# Patient Record
Sex: Female | Born: 1950 | Race: White | Hispanic: No | Marital: Married | Smoking: Never smoker
Health system: Southern US, Community
[De-identification: ages and names within clinical notes are randomized; demographics above are authoritative.]

## PROBLEM LIST (undated history)

## (undated) DIAGNOSIS — M1990 Unspecified osteoarthritis, unspecified site: Secondary | ICD-10-CM

## (undated) DIAGNOSIS — H269 Unspecified cataract: Secondary | ICD-10-CM

## (undated) DIAGNOSIS — T7840XA Allergy, unspecified, initial encounter: Secondary | ICD-10-CM

## (undated) DIAGNOSIS — I10 Essential (primary) hypertension: Secondary | ICD-10-CM

## (undated) HISTORY — DX: Unspecified cataract: H26.9

## (undated) HISTORY — DX: Essential (primary) hypertension: I10

## (undated) HISTORY — PX: BREAST BIOPSY: SHX20

## (undated) HISTORY — DX: Allergy, unspecified, initial encounter: T78.40XA

## (undated) HISTORY — DX: Unspecified osteoarthritis, unspecified site: M19.90

---
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
## 1995-10-15 HISTORY — PX: KNEE SURGERY: SHX244

## 1998-10-14 HISTORY — PX: HYSTEROSCOPY: SHX211

## 2005-10-14 HISTORY — PX: BREAST SURGERY: SHX581

## 2013-12-06 DIAGNOSIS — H5210 Myopia, unspecified eye: Secondary | ICD-10-CM | POA: Insufficient documentation

## 2013-12-06 DIAGNOSIS — H52229 Regular astigmatism, unspecified eye: Secondary | ICD-10-CM | POA: Insufficient documentation

## 2013-12-06 DIAGNOSIS — H43812 Vitreous degeneration, left eye: Secondary | ICD-10-CM | POA: Insufficient documentation

## 2013-12-06 DIAGNOSIS — H25099 Other age-related incipient cataract, unspecified eye: Secondary | ICD-10-CM | POA: Insufficient documentation

## 2016-06-11 LAB — HM PAP SMEAR

## 2016-06-28 LAB — TSH: TSH: 2.18 (ref 0.41–5.90)

## 2016-06-28 LAB — LIPID PANEL
CHOLESTEROL: 226 — AB (ref 0–200)
HDL: 83 — AB (ref 35–70)
LDL Cholesterol: 130
TRIGLYCERIDES: 64 (ref 40–160)

## 2016-06-28 LAB — CBC AND DIFFERENTIAL: WBC: 4

## 2016-06-28 LAB — BASIC METABOLIC PANEL: GLUCOSE: 99

## 2016-07-24 LAB — HM DEXA SCAN

## 2016-07-26 LAB — HM MAMMOGRAPHY

## 2017-04-03 DIAGNOSIS — H25812 Combined forms of age-related cataract, left eye: Secondary | ICD-10-CM | POA: Insufficient documentation

## 2017-04-03 HISTORY — PX: EYE SURGERY: SHX253

## 2017-04-15 ENCOUNTER — Encounter: Payer: Self-pay | Admitting: Osteopathic Medicine

## 2017-04-15 ENCOUNTER — Ambulatory Visit (INDEPENDENT_AMBULATORY_CARE_PROVIDER_SITE_OTHER): Payer: Medicare Other

## 2017-04-15 ENCOUNTER — Ambulatory Visit (INDEPENDENT_AMBULATORY_CARE_PROVIDER_SITE_OTHER): Payer: Medicare Other | Admitting: Osteopathic Medicine

## 2017-04-15 VITALS — BP 133/65 | HR 85 | Wt 162.0 lb

## 2017-04-15 DIAGNOSIS — M79672 Pain in left foot: Secondary | ICD-10-CM

## 2017-04-15 DIAGNOSIS — M7750 Other enthesopathy of unspecified foot: Secondary | ICD-10-CM | POA: Diagnosis not present

## 2017-04-15 DIAGNOSIS — I10 Essential (primary) hypertension: Secondary | ICD-10-CM

## 2017-04-15 MED ORDER — MELOXICAM 15 MG PO TABS: 7.5000 mg | ORAL_TABLET | Freq: Every day | ORAL | 2 refills | Status: DC

## 2017-04-15 NOTE — Progress Notes (Signed)
HPI: Carol Sanford is a 66 y.o. female  who presents to [facility] today, 04/15/17,  for chief complaint of:  Chief Complaint  Patient presents with  . Establish Care  . Foot Pain    Left foot pain for couple months. She notice the pain after wearing low support shoes. She states the pain is some better after changing her shoes. She has taken ibuprofen and has had some relief.   . Hand Pain    Right hand 3rd finger pain for 3 days. Denies injury.     Foot pain . Context: No injury on this foot, but has been more involved in workout regimen lately with Silver sneakers. . Location: Left middle arch . Quality: Sore, occasionally sharp . Duration: Couple months . Modifying factors: Supportive shoes seem to be helpful . Assoc signs/symptoms:   Hand pain  . Location: Third finger of right hand . Quality: Soreness . Severity: He actually has resolved by the time of the visit . Context: No known injury or strain    Past medical history, surgical history, social history and family history reviewed.  Patient Active Problem List   Diagnosis Date Noted  . Hypertension    Past Surgical History:  Procedure Laterality Date  . APPENDECTOMY  1965  . BREAST SURGERY Left 2007   Biopsy left breast benign   . EYE SURGERY Left 04/03/2017   Cataract  . HYSTEROSCOPY  2000  . KNEE SURGERY Left 1997   Social History   Social History  . Marital status: Married    Spouse name: N/A  . Number of children: N/A  . Years of education: N/A   Occupational History  . Not on file.   Social History Main Topics  . Smoking status: Never Smoker  . Smokeless tobacco: Never Used  . Alcohol use Yes  . Drug use: No  . Sexual activity: Not Currently   Other Topics Concern  . Not on file   Social History Narrative  . No narrative on file   Family History  Problem Relation Age of Onset  . Heart attack Mother   . Stroke Mother   . Alcohol abuse Father   .  Hypertension Father      Current medication list and allergy/intolerance information reviewed.  Rx include Losartan   Allergies  Allergen Reactions  . Sulfa Antibiotics Itching      Review of Systems:  Constitutional: No recent illness  HEENT: No  headache, no vision change  Cardiac: No  chest pain, No  pressure, No palpitations  Respiratory:  No  shortness of breath. No  Cough  Gastrointestinal: No  abdominal pain, no change on bowel habits  Musculoskeletal: +new myalgia/arthralgia  Skin: No  Rash  Hem/Onc: No  easy bruising/bleeding, No  abnormal lumps/bumps  Neurologic: No  weakness, No  Dizziness  Psychiatric: No  concerns with depression, No  concerns with anxiety  Exam:  BP 133/65   Pulse 85   Wt 162 lb (73.5 kg)   SpO2 100%   Constitutional: VS see above. General Appearance: alert, well-developed, well-nourished, NAD  Eyes: Normal lids and conjunctive, non-icteric sclera  Ears, Nose, Mouth, Throat: MMM, Normal external inspection ears/nares/mouth/lips/gums.  Neck: No masses, trachea midline.   Respiratory: Normal respiratory effort. no wheeze, no rhonchi, no rales  Cardiovascular: S1/S2 normal, no murmur, no rub/gallop auscultated. RRR.   Musculoskeletal: Gait normal. Symmetric and independent movement of all extremities. Mild tenderness to arch, nothing at calcaneus  or along plantar fascia on left foot. Right fingers appear normal, normal range of motion, no joint effusion  Neurological: Normal balance/coordination. No tremor.  Skin: warm, dry, intact.   Psychiatric: Normal judgment/insight. Normal mood and affect. Oriented x3.     ASSESSMENT/PLAN:   Foot tendinitis - Doubtful for stress fracture given vast improvement depending on foot wear, consider MRI versus immobilization if no better - Plan: DG Foot Complete Left, meloxicam (MOBIC) 15 MG tablet  Essential hypertension - Plan: losartan (COZAAR) 50 MG tablet, CBC, COMPLETE METABOLIC PANEL  WITH GFR, Lipid panel, TSH    Patient Instructions  Plan: Anti-inflammatories x1 week See sports medicine here (Dr. Denyse Amassorey or Dr. Karie Schwalbe) if no better in 2 weeks Plan for annual physical in September   Records requested from previous PCP  Follow-up plan: Return for annual physical in 06/2017 - labs prior to that visit .  Visit summary with medication list and pertinent instructions was printed for patient to review, alert us if any changes needed. All questions at time of visit were answered - patient instructed to contact office with any additional concerns. ER/RTC precautions were reviewed with the patient and understanding verbalized.   Note: Total time spent 30 minutes, greater than 50% of the visit was spent face-to-face counseling and coordinating care for the following: The primary encounter diagnosis was Foot tendinitis. A diagnosis of Essential hypertension was also pertinent to this visit..Marland Kitchen

## 2017-04-15 NOTE — Patient Instructions (Signed)
Plan: Anti-inflammatories x1 week See sports medicine here (Dr. Denyse Amassorey or Dr. Karie Schwalbe) if no better in 2 weeks Plan for annual physical in September

## 2017-06-12 ENCOUNTER — Telehealth: Payer: Self-pay | Admitting: Osteopathic Medicine

## 2017-06-12 NOTE — Telephone Encounter (Signed)
Patient called she has cpe scheduled for 06/19/17 getting labs done tomorrow and has mentioned in prev visit to Dr. Lyn HollingsheadAlexander of wanting a Prolia Injec and she would like to know if she can get that injec and does that need to be ordered. Thanks

## 2017-06-13 LAB — CBC
HEMATOCRIT: 36.8 % (ref 35.0–45.0)
HEMOGLOBIN: 12.2 g/dL (ref 11.7–15.5)
MCH: 31.3 pg (ref 27.0–33.0)
MCHC: 33.2 g/dL (ref 32.0–36.0)
MCV: 94.4 fL (ref 80.0–100.0)
MPV: 10.7 fL (ref 7.5–12.5)
Platelets: 237 10*3/uL (ref 140–400)
RBC: 3.9 MIL/uL (ref 3.80–5.10)
RDW: 13.5 % (ref 11.0–15.0)
WBC: 3.4 10*3/uL — ABNORMAL LOW (ref 3.8–10.8)

## 2017-06-13 NOTE — Telephone Encounter (Signed)
Called patient back she advised that she has been getting Prolia injections for the last 3 years and she was scheduled at prevs pcp office for end of April and that office called and canceled Dr. Had something to do and they rescheduled and then canceled her 2 more times. Pt adv that she called the prevs pcp and they said they sent records.

## 2017-06-13 NOTE — Telephone Encounter (Signed)
Left message on patient vm to call back in reference to  this message. Tudor Chandley,CMA

## 2017-06-13 NOTE — Telephone Encounter (Signed)
We need to special order the Prolia injections.   I don't think I ever received records from her previous PCP - can we confirm with her that she was on Prolia before and if so when was her last injection? The patient may have mentioned it to me at her last visit, but since I don't have records, I don't have any documented history of previous bone density test results to confirm osteoporosis.   I'm okay to go ahead and order it if she's been on in before, would like to have records but if we can't get them or if the patient has never been on Prolia before, we can discuss it at her annual

## 2017-06-13 NOTE — Telephone Encounter (Signed)
Okay to go ahead and order Prolene. [facility]. Thanks!

## 2017-06-14 LAB — COMPLETE METABOLIC PANEL WITH GFR
ALT: 13 U/L (ref 6–29)
AST: 15 U/L (ref 10–35)
Albumin: 4.6 g/dL (ref 3.6–5.1)
Alkaline Phosphatase: 35 U/L (ref 33–130)
BUN: 20 mg/dL (ref 7–25)
CO2: 25 mmol/L (ref 20–32)
CREATININE: 0.85 mg/dL (ref 0.50–0.99)
Calcium: 9.4 mg/dL (ref 8.6–10.4)
Chloride: 103 mmol/L (ref 98–110)
GFR, EST AFRICAN AMERICAN: 83 mL/min (ref >=60)
GFR, EST NON AFRICAN AMERICAN: 72 mL/min (ref >=60)
Glucose, Bld: 92 mg/dL (ref 65–99)
Potassium: 4.7 mmol/L (ref 3.5–5.3)
Sodium: 137 mmol/L (ref 135–146)
TOTAL PROTEIN: 6.6 g/dL (ref 6.1–8.1)
Total Bilirubin: 0.7 mg/dL (ref 0.2–1.2)

## 2017-06-14 LAB — LIPID PANEL
CHOLESTEROL: 206 mg/dL — AB (ref <200)
HDL: 94 mg/dL (ref >50)
LDL CALC: 101 mg/dL — AB (ref <100)
TRIGLYCERIDES: 56 mg/dL (ref <150)
Total CHOL/HDL Ratio: 2.2 Ratio (ref <5.0)
VLDL: 11 mg/dL (ref <30)

## 2017-06-14 LAB — TSH: TSH: 4.4 mIU/L

## 2017-06-18 NOTE — Telephone Encounter (Signed)
We'll look for the records after office hours today and get back to you on this tomorrow.

## 2017-06-18 NOTE — Telephone Encounter (Signed)
Did you receive a copy of her last bone density report? This will be required for prior authorization.

## 2017-06-18 NOTE — Telephone Encounter (Signed)
Dr. Alexander please see note below. Carol Sanford,CMA  

## 2017-06-18 NOTE — Telephone Encounter (Signed)
Kelsi please see note below. Aritza Brunet,CMA  

## 2017-06-19 ENCOUNTER — Other Ambulatory Visit (HOSPITAL_COMMUNITY)
Admission: RE | Admit: 2017-06-19 | Discharge: 2017-06-19 | Disposition: A | Discharge status: Home / Self Care | Payer: Medicare Other | Source: Ambulatory Visit | Attending: Osteopathic Medicine | Admitting: Osteopathic Medicine

## 2017-06-19 ENCOUNTER — Encounter: Payer: Self-pay | Admitting: Osteopathic Medicine

## 2017-06-19 ENCOUNTER — Telehealth: Payer: Self-pay | Admitting: Osteopathic Medicine

## 2017-06-19 ENCOUNTER — Ambulatory Visit (INDEPENDENT_AMBULATORY_CARE_PROVIDER_SITE_OTHER): Payer: Medicare Other | Admitting: Family Medicine

## 2017-06-19 ENCOUNTER — Encounter: Payer: Self-pay | Admitting: Family Medicine

## 2017-06-19 ENCOUNTER — Ambulatory Visit (INDEPENDENT_AMBULATORY_CARE_PROVIDER_SITE_OTHER): Payer: Medicare Other | Admitting: Osteopathic Medicine

## 2017-06-19 VITALS — BP 129/73 | HR 76 | Ht 64.5 in | Wt 161.0 lb

## 2017-06-19 DIAGNOSIS — Z23 Encounter for immunization: Secondary | ICD-10-CM | POA: Diagnosis not present

## 2017-06-19 DIAGNOSIS — M722 Plantar fascial fibromatosis: Secondary | ICD-10-CM | POA: Insufficient documentation

## 2017-06-19 DIAGNOSIS — D72819 Decreased white blood cell count, unspecified: Secondary | ICD-10-CM | POA: Insufficient documentation

## 2017-06-19 DIAGNOSIS — I10 Essential (primary) hypertension: Secondary | ICD-10-CM | POA: Diagnosis not present

## 2017-06-19 DIAGNOSIS — Z8739 Personal history of other diseases of the musculoskeletal system and connective tissue: Secondary | ICD-10-CM | POA: Diagnosis not present

## 2017-06-19 DIAGNOSIS — Z0000 Encounter for general adult medical examination without abnormal findings: Secondary | ICD-10-CM

## 2017-06-19 MED ORDER — FLUTICASONE PROPIONATE 50 MCG/ACT NA SUSP: 2.0000 | Freq: Every day | NASAL | 3 refills | Status: DC

## 2017-06-19 MED ORDER — ZOSTER VAC RECOMB ADJUVANTED 50 MCG/0.5ML IM SUSR: 0.5000 mL | Freq: Once | INTRAMUSCULAR | 1 refills | Status: AC

## 2017-06-19 NOTE — Patient Instructions (Addendum)
PLAN:   Vaccines:  Shingles - Rx printed  Tetanus - every 10 years  Pneumonia - second and last one due   Flu - annual   Other screening:  We need results for colonoscopy, bone density, Pap, mammogram - will request these form your previous doctors (PCP and women's health)   Repeat blood counts in 3 months - if stable low white blood cells, will just monitor every year   Bones!   I'll go ahead and order the Prolia and maybe it will be approved but they might require the bone density reports! We will be in touch. Please call us if you haven't heard back about this in the next 1-2 weeks.   Other:  See additional info printed for advanced directives and let me know if any questions

## 2017-06-19 NOTE — Telephone Encounter (Signed)
I will need the Pt's T Score from last bone density report. Records were requested this morning, Pt has been advised of status.

## 2017-06-19 NOTE — Telephone Encounter (Signed)
Found records with recent lab results and recent progress note but no copy of bone density test results. We'll need to request this specifically [facility] [place] [phone]

## 2017-06-19 NOTE — Patient Instructions (Addendum)
Thank you for coming in today. Do the ice massage nightly. Make sure to pull your big toe back.  Use a frozen cup of water.   Do the heel lift exercises.  Make sure to go down slowly. IT should take 3-4 seconds per lift.  Do about 30 reps 2-3x daily.   Do not walk barefoot.   Consider getting a night splint or a [place] sock.   Recheck with me in 6 weeks or sooner if needed.     Plantar Fasciitis Plantar fasciitis is a painful foot condition that affects the heel. It occurs when the band of tissue that connects the toes to the heel bone (plantar fascia) becomes irritated. This can happen after exercising too much or doing other repetitive activities (overuse injury). The pain from plantar fasciitis can range from mild irritation to severe pain that makes it difficult for you to walk or move. The pain is usually worse in the morning or after you have been sitting or lying down for a while. What are the causes? This condition may be caused by:  Standing for long periods of time.  Wearing shoes that do not fit.  Doing high-impact activities, including running, aerobics, and ballet.  Being overweight.  Having an abnormal way of walking (gait).  Having tight calf muscles.  Having high arches in your feet.  Starting a new athletic activity.  What are the signs or symptoms? The main symptom of this condition is heel pain. Other symptoms include:  Pain that gets worse after activity or exercise.  Pain that is worse in the morning or after resting.  Pain that goes away after you walk for a few minutes.  How is this diagnosed? This condition may be diagnosed based on your signs and symptoms. Your health care provider will also do a physical exam to check for:  A tender area on the bottom of your foot.  A high arch in your foot.  Pain when you move your foot.  Difficulty moving your foot.  You may also need to have imaging studies to confirm the diagnosis. These can  include:  X-rays.  Ultrasound.  MRI.  How is this treated? Treatment for plantar fasciitis depends on the severity of the condition. Your treatment may include:  Rest, ice, and over-the-counter pain medicines to manage your pain.  Exercises to stretch your calves and your plantar fascia.  A splint that holds your foot in a stretched, upward position while you sleep (night splint).  Physical therapy to relieve symptoms and prevent problems in the future.  Cortisone injections to relieve severe pain.  Extracorporeal shock wave therapy (ESWT) to stimulate damaged plantar fascia with electrical impulses. It is often used as a last resort before surgery.  Surgery, if other treatments have not worked after 12 months.  Follow these instructions at home:  Take medicines only as directed by your health care provider.  Avoid activities that cause pain.  Roll the bottom of your foot over a bag of ice or a bottle of cold water. Do this for 20 minutes, 3-4 times a day.  Perform simple stretches as directed by your health care provider.  Try wearing athletic shoes with air-sole or gel-sole cushions or soft shoe inserts.  Wear a night splint while sleeping, if directed by your health care provider.  Keep all follow-up appointments with your health care provider. How is this prevented?  Do not perform exercises or activities that cause heel pain.  Consider finding low-impact  activities if you continue to have problems.  Lose weight if you need to. The best way to prevent plantar fasciitis is to avoid the activities that aggravate your plantar fascia. Contact a health care provider if:  Your symptoms do not go away after treatment with home care measures.  Your pain gets worse.  Your pain affects your ability to move or do your daily activities. This information is not intended to replace advice given to you by your health care provider. Make sure you discuss any questions you  have with your health care provider. Document Released: 06/25/2001 Document Revised: 03/04/2016 Document Reviewed: 08/10/2014 Elsevier Interactive Patient Education  [employer]2018 [employer]

## 2017-06-19 NOTE — Telephone Encounter (Signed)
-----   Message from Sunnie NielsenNatalie Alexander, DO sent at 06/19/2017  1:00 PM EDT ----- Regarding: prolia We are working on getting a copy of the patient's previous bone density test results, can we go ahead and try to initiate prolia? Or should we wait? What do we need!? Thanks

## 2017-06-19 NOTE — Progress Notes (Signed)
Carol Sanford is a 66 y.o. female who presents to [facility] today for left foot pain. Patient notes a several month history of left plantar foot pain. She denies any injury but notes the pain occurred after she increased her activity. The pain is located at the plantar calcaneus. Pain is worse with activity and better with rest. She also notes pain when she first gets out of bed the morning. She's had plantar fasciitis about 10 years ago he notes the pain is similar. She's tried treatment with some home stretching and sports insoles which have helped a bit. She denies any radiating pain weakness or numbness fevers or chills.   Past Medical History:  Diagnosis Date  . Hypertension    Past Surgical History:  Procedure Laterality Date  . APPENDECTOMY  1965  . BREAST SURGERY Left 2007   Biopsy left breast benign   . EYE SURGERY Left 04/03/2017   Cataract  . HYSTEROSCOPY  2000  . KNEE SURGERY Left 1997   Social History  Substance Use Topics  . Smoking status: Never Smoker  . Smokeless tobacco: Never Used  . Alcohol use Yes     ROS:  As above   Medications: Current Outpatient Prescriptions  Medication Sig Dispense Refill  . CALCIUM PO Take 1 tablet by mouth daily.    . fluticasone (FLONASE) 50 MCG/ACT nasal spray Place 2 sprays into both nostrils daily. 48 g 3  . Glucosamine-Chondroitin 750-600 MG TABS Take 1 capsule by mouth daily.    Marland Kitchen losartan (COZAAR) 50 MG tablet Take 50 mg by mouth daily.    . meloxicam (MOBIC) 15 MG tablet Take 0.5-1 tablets (7.5-15 mg total) by mouth daily. For one week then daily as needed for pain 30 tablet 2  . Multiple Vitamin (MULTIVITAMIN) capsule Take 1 capsule by mouth.    . Zoster Vac Recomb Adjuvanted [facility]) injection Inject 0.5 mLs into the muscle once. x2 doses at 0 and 2-6 months 1 each 1   No current facility-administered medications for this visit.    Allergies  Allergen Reactions  .  Sulfa Antibiotics Itching     Exam:  BP (!) 151/79   Pulse 76   Wt 161 lb (73 kg)  General: Well Developed, well nourished, and in no acute distress.  Neuro/Psych: Alert and oriented x3, extra-ocular muscles intact, able to move all 4 extremities, sensation grossly intact. Skin: Warm and dry, no rashes noted.  Respiratory: Not using accessory muscles, speaking in full sentences, trachea midline.  Cardiovascular: Pulses palpable, no extremity edema. Abdomen: Does not appear distended. MSK:  Left foot is normal-appearing Tender to palpation medial plantar calcaneus. Pulses capillary refill sensation and motion are intact. Strength is intact.  Study Result   CLINICAL DATA:  Severe left foot pain which began 2 months ago after wearing a new pair shoes.  EXAM: LEFT FOOT - COMPLETE 3+ VIEW  COMPARISON:  None in PACs  FINDINGS: The bones of the foot are subjectively adequately mineralized. There is no acute or healing fracture. The joint spaces are reasonably well-maintained. There is no lytic or blastic bony lesion. The soft tissues exhibit no acute abnormalities. There is a tiny plantar calcaneal spur.  IMPRESSION: There is no acute or significant chronic bony abnormality of the left foot.   Electronically Signed   By: David  [place] M.D.   On: 04/15/2017 15:59       Assessment and Plan: 66 y.o. female with Left foot pain  very likely plantar fasciitis. Plan for treatment with eccentric exercises, and ice massage. Plan to recheck in 6 weeks. Return sooner if needed.    Discussed warning signs or symptoms. Please see discharge instructions. Patient expresses understanding.  I spent 25 minutes with this patient, greater than 50% was face-to-face time counseling regarding differential diagnosis treatment plan options and discussed injections..Marland Kitchen

## 2017-06-19 NOTE — Progress Notes (Signed)
HPI: Carol Sanford is a 66 y.o. female  who presents to [facility] today, 06/19/17,  for Medicare Annual Wellness Exam  Patient presents for annual physical/Medicare wellness exam. no complaints today.  Been on Prolia since 2013. Reports history of osteoporosis but risk factors promted treatment.   Past medical, surgical, social and family history reviewed:  Patient Active Problem List   Diagnosis Date Noted  . Hypertension   . PVD (posterior vitreous detachment), left eye 12/06/2013  . Regular astigmatism 12/06/2013  . Senile incipient cataract 12/06/2013    Past Surgical History:  Procedure Laterality Date  . APPENDECTOMY  1965  . BREAST SURGERY Left 2007   Biopsy left breast benign   . EYE SURGERY Left 04/03/2017   Cataract  . HYSTEROSCOPY  2000  . KNEE SURGERY Left 1997    Social History   Social History  . Marital status: Married    Spouse name: N/A  . Number of children: N/A  . Years of education: N/A   Occupational History  . Not on file.   Social History Main Topics  . Smoking status: Never Smoker  . Smokeless tobacco: Never Used  . Alcohol use Yes  . Drug use: No  . Sexual activity: Not Currently   Other Topics Concern  . Not on file   Social History Narrative  . No narrative on file    Family History  Problem Relation Age of Onset  . Heart attack Mother   . Stroke Mother   . Alcohol abuse Father   . Hypertension Father      Current medication list and allergy/intolerance information reviewed:    Outpatient Encounter Prescriptions as of 06/19/2017  Medication Sig  . CALCIUM PO Take 1 tablet by mouth daily.  . Glucosamine-Chondroitin 750-600 MG TABS Take 1 capsule by mouth daily.  Marland Kitchen losartan (COZAAR) 50 MG tablet Take 50 mg by mouth daily.  . meloxicam (MOBIC) 15 MG tablet Take 0.5-1 tablets (7.5-15 mg total) by mouth daily. For one week then daily as needed for pain  . Multiple Vitamin (MULTIVITAMIN)  capsule Take 1 capsule by mouth.   No facility-administered encounter medications on file as of 06/19/2017.     Allergies  Allergen Reactions  . Sulfa Antibiotics Itching       Review of Systems: Review of Systems - General ROS: negative   Medicare Wellness Questionnaire  Are there smokers in your home (other than you)? no  Depression Screen (Note: if answer to either of the following is "Yes", a more complete depression screening is indicated)   Q1: Over the past two weeks, have you felt down, depressed or hopeless? no  Q2: Over the past two weeks, have you felt little interest or pleasure in doing things? no  Have you lost interest or pleasure in daily life? no  Do you often feel hopeless? no  Do you cry easily over simple problems? no  Activities of Daily Living In your present state of health, do you have any difficulty performing the following activities?:  Driving? no Managing money?  no Feeding yourself? no Getting from bed to chair? no Climbing a flight of stairs? no Preparing food and eating?: no Bathing or showering? no Getting dressed: no Getting to the toilet? no Using the toilet: no Moving around from place to place: no In the past year have you fallen or had a near fall?: no  Hearing Difficulties:  Do you often ask people to speak up  or repeat themselves? no Do you experience ringing or noises in your ears? no  Do you have difficulty understanding soft or whispered voices? no  Memory Difficulties:  Do you feel that you have a problem with memory? no  Do you often misplace items? no  Do you feel safe at home?  yes  Sexual Health:   Are you sexually active?  Yes  Do you have more than one partner?  No  Advanced Directives:   Advanced directives discussed: has NO advanced directive  - add't info requested. Referral to SW: not applicable  Additional information provided: yes  Risk Factors  Current exercise habits: 3x/week at fitness  center  Dietary issues discussed:no concerns  Cardiac risk factors: hypertension   Exam:  BP 129/73   Pulse 76   Ht 5' 4.5" (1.638 m)   Wt 161 lb (73 kg)   BMI 27.21 kg/m  Vision by Snellen chart: right eye:see nurse notes, left eye:see nurse notes  Constitutional: VS see above. General Appearance: alert, well-developed, well-nourished, NAD  Ears, Nose, Mouth, Throat: MMM  Neck: No masses, trachea midline.   Respiratory: Normal respiratory effort. no wheeze, no rhonchi, no rales  Cardiovascular:No lower extremity edema.   Musculoskeletal: Gait normal. No clubbing/cyanosis of digits.   Neurological: Normal balance/coordination. No tremor. Recalls 3 objects and able to read face of watch with correct time.   Skin: warm, dry, intact. No rash/ulcer.   Psychiatric: Normal judgment/insight. Normal mood and affect. Oriented x3.     ASSESSMENT/PLAN:   Encounter for Medicare annual wellness exam  Need for immunization against influenza - Plan: Flu vaccine HIGH DOSE PF  CANCER SCREENING  Lung - does not need  Colon - does not need  Prostate - does not need  Breast - needs  Cervical - needs OTHER DISEASE SCREENING  Lipid - does not need  DM2 - does not need  AAA - female 16-75yo ever smoked - does not need  Osteoporosis - women 65yo+, men 65yo+ - needs - we don't have records, pt previously on Prolia  INFECTIOUS DISEASE SCREENING  HIV - needs  GC/CT - does not need  HepC -needs  TB - does not need ADULT VACCINATION  Influenza - annual vaccine recommended  Td - booster every 10 years - needs  Zoster - option at 75, yes at 60+ - needs  PCV13 - does not need  PPSV23 - needs Immunization History  Administered Date(s) Administered  . Influenza, High Dose Seasonal PF 06/19/2017   OTHER  Fall - exercise and Vit D age 71+ - needs  Advanced Directives -  Discussed as above  Patient Instructions  PLAN:   Vaccines:  Shingles - Rx  printed  Tetanus - every 10 years  Pneumonia - second and last one due   Flu - annual   Other screening:  We need results for colonoscopy, bone density, Pap, mammogram - will request these form your previous doctors (PCP and women's health)   Repeat blood counts in 3 months - if stable low white blood cells, will just monitor every year   Bones!   I'll go ahead and order the Prolia and maybe it will be approved but they might require the bone density reports! We will be in touch. Please call us if you haven't heard back about this in the next 1-2 weeks.   Other:  See additional info printed for advanced directives and let me know if any questions    During the  course of the visit the patient was educated and counseled about appropriate screening and preventive services as noted above.   Patient Instructions (the written plan) was given to the patient.  Medicare Attestation I have personally reviewed: The patient's medical and social history Their use of alcohol, tobacco or illicit drugs Their current medications and supplements The patient's functional ability including ADLs,fall risks, home safety risks, cognitive, and hearing and visual impairment Diet and physical activities Evidence for depression or mood disorders  The patient's weight, height, BMI, and visual acuity have been recorded in the chart.  I have made referrals, counseling, and provided education to the patient based on review of the above and I have provided the patient with a written personalized care plan for preventive services.     Carol Nielsenatalie Ralph Benavidez, DO   06/19/17   Visit summary with medication list and pertinent instructions was printed for patient to review. All questions at time of visit were answered - patient instructed to contact office with any additional concerns. ER/RTC precautions were reviewed with the patient. Follow-up plan: No Follow-up on file.

## 2017-06-19 NOTE — Telephone Encounter (Signed)
Records requested

## 2017-06-20 ENCOUNTER — Telehealth: Payer: Self-pay | Admitting: Osteopathic Medicine

## 2017-06-20 LAB — CYTOLOGY - PAP
DIAGNOSIS: NEGATIVE
HPV: NOT DETECTED

## 2017-06-20 NOTE — Telephone Encounter (Signed)
I received this patient's most recent bone density test report from October 2017. This was after she had been on earlier for some time so it doesn't look like she is actually an osteoporosis range, I'll get the report to you so that we can work on getting her back on the Prolia - let me know if there are any issues!

## 2017-06-24 ENCOUNTER — Encounter: Payer: Self-pay | Admitting: Osteopathic Medicine

## 2017-06-24 NOTE — Telephone Encounter (Signed)
Submitted information via [employer] online. No auth required. Prolia ordered. Pt advised. She is going out of town and will call the first of the week to schedule NV.

## 2017-06-24 NOTE — Telephone Encounter (Signed)
Submitted information via [employer] online. No auth required. Prolia ordered. Pt advised. She is going out of town and will call the first of the week to schedule NV.  

## 2017-06-25 ENCOUNTER — Encounter: Payer: Self-pay | Admitting: Osteopathic Medicine

## 2017-07-03 ENCOUNTER — Ambulatory Visit (INDEPENDENT_AMBULATORY_CARE_PROVIDER_SITE_OTHER): Payer: Medicare Other | Admitting: Osteopathic Medicine

## 2017-07-03 VITALS — BP 139/71 | HR 71

## 2017-07-03 DIAGNOSIS — Z8739 Personal history of other diseases of the musculoskeletal system and connective tissue: Secondary | ICD-10-CM | POA: Diagnosis not present

## 2017-07-03 MED ORDER — DENOSUMAB 60 MG/ML ~~LOC~~ SOLN
60.0000 mg | Freq: Once | SUBCUTANEOUS | Status: AC
  Administered 2017-07-03: 60 mg via SUBCUTANEOUS

## 2017-07-03 NOTE — Progress Notes (Signed)
Pt in today for Prolia injection.  Given Bejou in left upper arm without any immediate complications.

## 2017-07-22 ENCOUNTER — Encounter: Payer: Self-pay | Admitting: Family Medicine

## 2017-07-22 ENCOUNTER — Ambulatory Visit (INDEPENDENT_AMBULATORY_CARE_PROVIDER_SITE_OTHER): Payer: Medicare Other | Admitting: Family Medicine

## 2017-07-22 VITALS — BP 169/82 | HR 70 | Temp 98.3°F | Wt 164.0 lb

## 2017-07-22 DIAGNOSIS — R05 Cough: Secondary | ICD-10-CM | POA: Diagnosis not present

## 2017-07-22 DIAGNOSIS — M722 Plantar fascial fibromatosis: Secondary | ICD-10-CM

## 2017-07-22 DIAGNOSIS — R0982 Postnasal drip: Secondary | ICD-10-CM

## 2017-07-22 DIAGNOSIS — R059 Cough, unspecified: Secondary | ICD-10-CM

## 2017-07-22 MED ORDER — BENZONATATE 200 MG PO CAPS: 200.0000 mg | ORAL_CAPSULE | Freq: Three times a day (TID) | ORAL | 0 refills | Status: DC | PRN

## 2017-07-22 MED ORDER — GUAIFENESIN-CODEINE 100-10 MG/5ML PO SOLN: 5.0000 mL | Freq: Every evening | ORAL | 0 refills | Status: DC | PRN

## 2017-07-22 MED ORDER — IPRATROPIUM BROMIDE 0.06 % NA SOLN: 2.0000 | NASAL | 6 refills | Status: DC | PRN

## 2017-07-22 NOTE — Patient Instructions (Signed)
Thank you for coming in today. Call or go to the emergency room if you get worse, have trouble breathing, have chest pains, or palpitations.   Let me know if you do not get better or if you get worse.   Use the atrovent spray.  Continue the flonase spray.  Use over the counter zyrtec (certizine) daily.  Use tessalon pearles during the day for cough suppression.  Use the codeine cough liquid at bedtime for cough as well.

## 2017-07-22 NOTE — Progress Notes (Signed)
Carol Sanford is a 66 y.o. female who presents to [facility]: [facility] Care Sports Medicine today for cough congestion and runny nose. Symptoms present now for about 3 weeks. Patient denies any significant shortness of breath chest pain or palpitations. She feels pretty well overall but has tried some over-the-counter medications which have not worked very well. She notes the cough interferes with her sleep at night and she finds it to be a bit obnoxious. She currently is using Flonase nasal spray which helps some.  Additionally she is here to follow-up her left plantar fasciitis. As was seen about a month ago. She's been using home exercise program heel cushions and a night splint. She notes significant improvement in symptoms.   Past Medical History:  Diagnosis Date  . Hypertension    Past Surgical History:  Procedure Laterality Date  . APPENDECTOMY  1965  . BREAST SURGERY Left 2007   Biopsy left breast benign   . EYE SURGERY Left 04/03/2017   Cataract  . HYSTEROSCOPY  2000  . KNEE SURGERY Left 1997   Social History  Substance Use Topics  . Smoking status: Never Smoker  . Smokeless tobacco: Never Used  . Alcohol use Yes   family history includes Alcohol abuse in her father; Heart attack in her mother; Hypertension in her father; Stroke in her mother.  ROS as above:  Medications: Current Outpatient Prescriptions  Medication Sig Dispense Refill  . CALCIUM PO Take 1 tablet by mouth daily.    . fluticasone (FLONASE) 50 MCG/ACT nasal spray Place 2 sprays into both nostrils daily. 48 g 3  . Glucosamine-Chondroitin 750-600 MG TABS Take 1 capsule by mouth daily.    Marland Kitchen losartan (COZAAR) 50 MG tablet Take 50 mg by mouth daily.    . Multiple Vitamin (MULTIVITAMIN) capsule Take 1 capsule by mouth.    . benzonatate (TESSALON) 200 MG capsule Take 1 capsule (200 mg total) by mouth 3 (three) times  daily as needed for cough. 45 capsule 0  . guaiFENesin-codeine 100-10 MG/5ML syrup Take 5 mLs by mouth at bedtime as needed for cough. 120 mL 0  . ipratropium (ATROVENT) 0.06 % nasal spray Place 2 sprays into both nostrils every 4 (four) hours as needed for rhinitis. 10 mL 6   No current facility-administered medications for this visit.    Allergies  Allergen Reactions  . Sulfa Antibiotics Itching    Health Maintenance Health Maintenance  Topic Date Due  . Hepatitis C Screening  1951-04-14  . HIV Screening  03/01/1967  . DEXA SCAN  07/24/2018  . MAMMOGRAM  07/26/2018  . PAP SMEAR  06/19/2020  . COLONOSCOPY  10/15/2023  . TETANUS/TDAP  06/20/2027  . INFLUENZA VACCINE  Completed  . PNA vac Low Risk Adult  Completed     Exam:  BP (!) 169/82   Pulse 70   Temp 98.3 F (36.8 C) (Oral)   Wt 164 lb (74.4 kg)   SpO2 100%   BMI 27.72 kg/m  Gen: Well NAD HEENT: EOMI,  MMM Posterior pharynx with cobblestoning. Clear nasal discharge with inflamed nasal turbinates bilaterally. No cervical lymphadenopathy. Sinuses are nontender. Normal tympanic membranes bilaterally. Lungs: Normal work of breathing. CTABL Heart: RRR no MRG Abd: NABS, Soft. Nondistended, Nontender Exts: Brisk capillary refill, warm and well perfused.    No results found for this or any previous visit (from the past 72 hour(s)). No results found.    Assessment and Plan: 66 y.o.  female with postnasal drainage with cough likely due to seasonal allergies. Recommend continuing Flonase nasal spray and adding cetirizine over-the-counter. Will additionally use prescription Atrovent nasal spray. Will control cough with Tessalon Perles and codeine cough syrup. If she worsens will prescribe prednisone and azithromycin.  Plan fasciitis she is improving. Plan to continue current regimen and recheck as needed.   No orders of the defined types were placed in this encounter.  Meds ordered this encounter  Medications  .  ipratropium (ATROVENT) 0.06 % nasal spray    Sig: Place 2 sprays into both nostrils every 4 (four) hours as needed for rhinitis.    Dispense:  10 mL    Refill:  6  . benzonatate (TESSALON) 200 MG capsule    Sig: Take 1 capsule (200 mg total) by mouth 3 (three) times daily as needed for cough.    Dispense:  45 capsule    Refill:  0  . guaiFENesin-codeine 100-10 MG/5ML syrup    Sig: Take 5 mLs by mouth at bedtime as needed for cough.    Dispense:  120 mL    Refill:  0     Discussed warning signs or symptoms. Please see discharge instructions. Patient expresses understanding.

## 2017-07-29 ENCOUNTER — Telehealth: Payer: Self-pay | Admitting: Family Medicine

## 2017-07-29 NOTE — Telephone Encounter (Signed)
Patient called she seen you on Tues 07/22/17 and still not any better. Pt was adv if not feeling any better to call back she need something else called into [facility] in [place] today if possible. Thanks

## 2017-07-29 NOTE — Telephone Encounter (Signed)
Pt would like a call to be advised that something else was called in please [phone]

## 2017-07-30 ENCOUNTER — Ambulatory Visit (INDEPENDENT_AMBULATORY_CARE_PROVIDER_SITE_OTHER): Payer: Medicare Other

## 2017-07-30 DIAGNOSIS — Z1231 Encounter for screening mammogram for malignant neoplasm of breast: Secondary | ICD-10-CM | POA: Diagnosis not present

## 2017-07-30 MED ORDER — AZITHROMYCIN 250 MG PO TABS: 250.0000 mg | ORAL_TABLET | Freq: Every day | ORAL | 0 refills | Status: DC

## 2017-07-30 MED ORDER — PREDNISONE 10 MG PO TABS: 30.0000 mg | ORAL_TABLET | Freq: Every day | ORAL | 0 refills | Status: DC

## 2017-07-30 NOTE — Telephone Encounter (Signed)
PT came in to the office today asking if something has been called in. She still isn't feeling well and would like something additional called in. She still has a deep cough and her chest hurts and has a lot of drainage.  Please advise if pt needs to come back in for an appt. She states she was told she could just call back to tell you she needs different medication.

## 2017-07-30 NOTE — Telephone Encounter (Signed)
Pt has been advised.

## 2017-07-30 NOTE — Telephone Encounter (Signed)
Prednisone and Azithromycin sent to pharmacy.

## 2017-07-31 ENCOUNTER — Ambulatory Visit: Payer: Medicare Other | Admitting: Family Medicine

## 2017-10-20 ENCOUNTER — Telehealth: Payer: Self-pay

## 2017-10-20 DIAGNOSIS — I10 Essential (primary) hypertension: Secondary | ICD-10-CM

## 2017-10-20 NOTE — Telephone Encounter (Signed)
Pharmacy requesting a refill for losartan potassium 50 mg. Rx was written by an external doctor. Pls advise, thanks.

## 2017-10-21 MED ORDER — LOSARTAN POTASSIUM 50 MG PO TABS: 50.0000 mg | ORAL_TABLET | Freq: Every day | ORAL | 1 refills | Status: DC

## 2017-10-21 NOTE — Telephone Encounter (Signed)
Refills sent

## 2017-10-21 NOTE — Telephone Encounter (Signed)
Vm msg left with call back number for pt regarding medication refill.

## 2018-03-31 ENCOUNTER — Ambulatory Visit (INDEPENDENT_AMBULATORY_CARE_PROVIDER_SITE_OTHER): Payer: Medicare Other | Admitting: Osteopathic Medicine

## 2018-03-31 ENCOUNTER — Encounter: Payer: Self-pay | Admitting: Osteopathic Medicine

## 2018-03-31 VITALS — BP 143/88 | HR 76 | Temp 98.3°F | Wt 168.4 lb

## 2018-03-31 DIAGNOSIS — I10 Essential (primary) hypertension: Secondary | ICD-10-CM | POA: Diagnosis not present

## 2018-03-31 MED ORDER — ZOSTER VAC RECOMB ADJUVANTED 50 MCG/0.5ML IM SUSR: 0.5000 mL | Freq: Once | INTRAMUSCULAR | 1 refills | Status: AC

## 2018-03-31 MED ORDER — HYDROCHLOROTHIAZIDE 25 MG PO TABS: 25.0000 mg | ORAL_TABLET | Freq: Every day | ORAL | 1 refills | Status: DC

## 2018-03-31 MED ORDER — ZOSTER VAC RECOMB ADJUVANTED 50 MCG/0.5ML IM SUSR: 0.5000 mL | Freq: Once | INTRAMUSCULAR | 1 refills | Status: DC

## 2018-03-31 NOTE — Progress Notes (Signed)
HPI: Carol Sanford is a 67 y.o. female who  has a past medical history of Hypertension.  she presents to [facility] today, 03/31/18,  for chief complaint of:  Hypertension follow-up  Has some concerns about some recent recalls of losartan, blood pressure not optimal control at any rate.  No chest pain, pressure, shortness of breath, palpitations, lower extremity edema.     Past medical history, surgical history, and family history reviewed.  Current medication list and allergy/intolerance information reviewed.   (See remainder of HPI, ROS, Phys Exam below)  BP (!) 143/88 (BP Location: Left Arm, Patient Position: Sitting, Cuff Size: Normal)   Pulse 76   Temp 98.3 F (36.8 C) (Oral)   Wt 168 lb 6.4 oz (76.4 kg)   BMI 28.46 kg/m     ASSESSMENT/PLAN:   Essential hypertension - Plan: BASIC METABOLIC PANEL WITH GFR   Meds ordered this encounter  Medications  . hydrochlorothiazide (HYDRODIURIL) 25 MG tablet    Sig: Take 1 tablet (25 mg total) by mouth daily.    Dispense:  90 tablet    Refill:  1  . DISCONTD: Zoster Vaccine Adjuvanted [facility]) injection    Sig: Inject 0.5 mLs into the muscle once for 1 dose. Repeat dose 6 mos    Dispense:  0.5 mL    Refill:  1  . Zoster Vaccine Adjuvanted [facility]) injection    Sig: Inject 0.5 mLs into the muscle once for 1 dose. Repeat dose 6 mos    Dispense:  0.5 mL    Refill:  1     Follow-up plan: Return in about 2 weeks (around 04/14/2018) for recheck BP at nurse visit on new medicines, lab visit ONLY in 6 weeks. Annual w/ Dr A in 06/2018.     ############################################ ############################################ ############################################ ############################################    Outpatient Encounter Medications as of 03/31/2018  Medication Sig  . CALCIUM PO Take 1 tablet by mouth daily.  . Glucosamine-Chondroitin 750-600 MG TABS Take 1 capsule  by mouth daily.  Marland Kitchen guaiFENesin-codeine 100-10 MG/5ML syrup Take 5 mLs by mouth at bedtime as needed for cough.  . losartan (COZAAR) 50 MG tablet Take 1 tablet (50 mg total) by mouth daily.  . Multiple Vitamin (MULTIVITAMIN) capsule Take 1 capsule by mouth.  . [DISCONTINUED] azithromycin (ZITHROMAX) 250 MG tablet Take 1 tablet (250 mg total) by mouth daily. Take first 2 tablets together, then 1 every day until finished. (Patient not taking: Reported on 03/31/2018)  . [DISCONTINUED] benzonatate (TESSALON) 200 MG capsule Take 1 capsule (200 mg total) by mouth 3 (three) times daily as needed for cough. (Patient not taking: Reported on 03/31/2018)  . [DISCONTINUED] fluticasone (FLONASE) 50 MCG/ACT nasal spray Place 2 sprays into both nostrils daily. (Patient not taking: Reported on 03/31/2018)  . [DISCONTINUED] ipratropium (ATROVENT) 0.06 % nasal spray Place 2 sprays into both nostrils every 4 (four) hours as needed for rhinitis. (Patient not taking: Reported on 03/31/2018)  . [DISCONTINUED] predniSONE (DELTASONE) 10 MG tablet Take 3 tablets (30 mg total) by mouth daily with breakfast. (Patient not taking: Reported on 03/31/2018)   No facility-administered encounter medications on file as of 03/31/2018.    Allergies  Allergen Reactions  . Sulfa Antibiotics Itching      Review of Systems:  Constitutional: No recent illness  HEENT: No  headache, no vision change  Cardiac: No  chest pain, No  pressure, No palpitations  Respiratory:  No  shortness of breath. No  Cough  Neurologic: No  weakness, No  Dizziness   Exam:  BP (!) 143/88 (BP Location: Left Arm, Patient Position: Sitting, Cuff Size: Normal)   Pulse 76   Temp 98.3 F (36.8 C) (Oral)   Wt 168 lb 6.4 oz (76.4 kg)   BMI 28.46 kg/m   Constitutional: VS see above. General Appearance: alert, well-developed, well-nourished, NAD  Eyes: Normal lids and conjunctive, non-icteric sclera  Ears, Nose, Mouth, Throat: MMM, Normal external  inspection ears/nares/mouth/lips/gums.  Neck: No masses, trachea midline.   Respiratory: Normal respiratory effort. no wheeze, no rhonchi, no rales  Cardiovascular: S1/S2 normal, no murmur, no rub/gallop auscultated. RRR.   Musculoskeletal: Gait normal.   Neurological: Normal balance/coordination. No tremor.  Skin: warm, dry, intact.   Psychiatric: Normal judgment/insight. Normal mood and affect. Oriented x3.   Visit summary with medication list and pertinent instructions was printed for patient to review, advised to alert us if any changes needed. All questions at time of visit were answered - patient instructed to contact office with any additional concerns. ER/RTC precautions were reviewed with the patient and understanding verbalized.   Follow-up plan: Return in about 2 weeks (around 04/14/2018) for recheck BP at nurse visit on new medicines, lab visit ONLY in 6 weeks. Annual w/ Dr A in 06/2018.    Please note: voice recognition software was used to produce this document, and typos may escape review. Please contact Dr. Lyn HollingsheadAlexander for any needed clarifications.

## 2018-04-01 ENCOUNTER — Encounter: Payer: Self-pay | Admitting: Osteopathic Medicine

## 2018-04-14 ENCOUNTER — Ambulatory Visit (INDEPENDENT_AMBULATORY_CARE_PROVIDER_SITE_OTHER): Payer: Medicare Other | Admitting: Osteopathic Medicine

## 2018-04-14 VITALS — BP 151/69 | HR 74 | Temp 98.9°F | Wt 165.0 lb

## 2018-04-14 DIAGNOSIS — I10 Essential (primary) hypertension: Secondary | ICD-10-CM

## 2018-04-14 NOTE — Progress Notes (Signed)
   Subjective:    Patient ID: Carol Sanford, female    DOB: 10/31/1950, 67 y.o.   MRN: [id number]  HPI Patient is here for blood pressure check after starting HCTZ. Denies trouble sleeping, or palpitations.   Patient does note headaches almost daily   Review of Systems     Objective:   Physical Exam       Assessment & Plan:   Patient's first blood pressure check was elevated at 151/69. Patient was left to rest in room for 10 minutes and BP was 135/77 on the recheck. Patient denies taking any home blood pressure readings. Reviewed readings today with Dr Lyn HollingsheadAlexander. Advised pt that blood pressures look good, but she needs to make OV if headaches persist.

## 2018-04-14 NOTE — Progress Notes (Signed)
BP (!) 151/69   Pulse 74   Temp 98.9 F (37.2 C) (Oral)   Wt 165 lb (74.8 kg)   BMI 27.88 kg/m   Repeat BP looks good We did not discuss headache issues last visit - pt will need follow-up for new symptom

## 2018-05-15 LAB — BASIC METABOLIC PANEL WITH GFR
BUN: 18 mg/dL (ref 7–25)
CO2: 29 mmol/L (ref 20–32)
CREATININE: 0.83 mg/dL (ref 0.50–0.99)
Calcium: 9.8 mg/dL (ref 8.6–10.4)
Chloride: 100 mmol/L (ref 98–110)
GFR, EST AFRICAN AMERICAN: 85 mL/min/{1.73_m2} (ref >=60)
GFR, EST NON AFRICAN AMERICAN: 73 mL/min/{1.73_m2} (ref >=60)
Glucose, Bld: 92 mg/dL (ref 65–99)
Potassium: 4.3 mmol/L (ref 3.5–5.3)
SODIUM: 136 mmol/L (ref 135–146)

## 2018-06-22 ENCOUNTER — Ambulatory Visit (INDEPENDENT_AMBULATORY_CARE_PROVIDER_SITE_OTHER): Payer: Medicare Other | Admitting: Osteopathic Medicine

## 2018-06-22 ENCOUNTER — Encounter: Payer: Self-pay | Admitting: Osteopathic Medicine

## 2018-06-22 VITALS — BP 135/83 | HR 78 | Temp 98.7°F | Wt 164.8 lb

## 2018-06-22 DIAGNOSIS — Z8739 Personal history of other diseases of the musculoskeletal system and connective tissue: Secondary | ICD-10-CM | POA: Diagnosis not present

## 2018-06-22 DIAGNOSIS — Z1231 Encounter for screening mammogram for malignant neoplasm of breast: Secondary | ICD-10-CM

## 2018-06-22 DIAGNOSIS — I10 Essential (primary) hypertension: Secondary | ICD-10-CM

## 2018-06-22 DIAGNOSIS — Z0000 Encounter for general adult medical examination without abnormal findings: Secondary | ICD-10-CM | POA: Diagnosis not present

## 2018-06-22 DIAGNOSIS — Z1239 Encounter for other screening for malignant neoplasm of breast: Secondary | ICD-10-CM

## 2018-06-22 MED ORDER — LOSARTAN POTASSIUM 50 MG PO TABS: 50.0000 mg | ORAL_TABLET | Freq: Every day | ORAL | 3 refills | Status: DC

## 2018-06-22 NOTE — Progress Notes (Signed)
HPI: Carol Sanford is a 67 y.o. female who  has a past medical history of Hypertension.  she presents to [facility] today, 06/22/18,  for chief complaint of: Annual physical   Patient here for annual physical / wellness exam.  See preventive care reviewed as below.    Additional concerns today include:  None     Past medical, surgical, social and family history reviewed:  Patient Active Problem List   Diagnosis Date Noted  . Plantar fasciitis, left 06/19/2017  . History of osteopenia 06/19/2017  . Hypertension   . PVD (posterior vitreous detachment), left eye 12/06/2013  . Regular astigmatism 12/06/2013  . Senile incipient cataract 12/06/2013    Past Surgical History:  Procedure Laterality Date  . APPENDECTOMY  1965  . BREAST BIOPSY Left    benign 9 to 10 years ago   . BREAST SURGERY Left 2007   Biopsy left breast benign   . EYE SURGERY Left 04/03/2017   Cataract  . HYSTEROSCOPY  2000  . KNEE SURGERY Left 1997    Social History   Tobacco Use  . Smoking status: Never Smoker  . Smokeless tobacco: Never Used  Substance Use Topics  . Alcohol use: Yes    Family History  Problem Relation Age of Onset  . Heart attack Mother   . Stroke Mother   . Alcohol abuse Father   . Hypertension Father      Current medication list and allergy/intolerance information reviewed:    Current Outpatient Medications  Medication Sig Dispense Refill  . CALCIUM PO Take 1 tablet by mouth daily.    . Glucosamine-Chondroitin 750-600 MG TABS Take 1 capsule by mouth daily.    . Multiple Vitamin (MULTIVITAMIN) capsule Take 1 capsule by mouth.    . hydrochlorothiazide (HYDRODIURIL) 25 MG tablet Take 1 tablet (25 mg total) by mouth daily. (Patient not taking: Reported on 06/22/2018) 90 tablet 1   No current facility-administered medications for this visit.     Allergies  Allergen Reactions  . Sulfa Antibiotics Itching  . Lisinopril Cough       Review of Systems:  Constitutional:  No  fever, no chills, No recent illness, No unintentional weight changes. No significant fatigue.   HEENT: No  headache, no vision change, no hearing change, No sore throat, No  sinus pressure  Cardiac: No  chest pain, No  pressure, No palpitations, No  Orthopnea  Respiratory:  No  shortness of breath. No  Cough  Gastrointestinal: No  abdominal pain, No  nausea, No  vomiting,  No  blood in stool, No  diarrhea, No  constipation   Musculoskeletal: No new myalgia/arthralgia  Skin: No  Rash, No other wounds/concerning lesions  Genitourinary: No  incontinence, No  abnormal genital bleeding, No abnormal genital discharge  Hem/Onc: No  easy bruising/bleeding, No  abnormal lymph node  Endocrine: No cold intolerance,  No heat intolerance. No polyuria/polydipsia/polyphagia   Neurologic: No  weakness, No  dizziness, No  slurred speech/focal weakness/facial droop  Psychiatric: No  concerns with depression, No  concerns with anxiety, No sleep problems, No mood problems  Exam:  BP 135/83 (BP Location: Left Arm, Patient Position: Sitting, Cuff Size: Normal)   Pulse 78   Temp 98.7 F (37.1 C) (Oral)   Wt 164 lb 12.8 oz (74.8 kg)   BMI 27.85 kg/m   Constitutional: VS see above. General Appearance: alert, well-developed, well-nourished, NAD  Eyes: Normal lids and conjunctive, non-icteric sclera  Ears,  Nose, Mouth, Throat: MMM, Normal external inspection ears/nares/mouth/lips/gums. TM normal bilaterally. Pharynx/tonsils no erythema, no exudate. Nasal mucosa normal.   Neck: No masses, trachea midline. No thyroid enlargement. No tenderness/mass appreciated. No lymphadenopathy  Respiratory: Normal respiratory effort. no wheeze, no rhonchi, no rales  Cardiovascular: S1/S2 normal, no murmur, no rub/gallop auscultated. RRR. No lower extremity edema.   Gastrointestinal: Nontender, no masses. No hepatomegaly, no splenomegaly. No hernia appreciated. Bowel  sounds normal. Rectal exam deferred.   Musculoskeletal: Gait normal. No clubbing/cyanosis of digits.   Neurological: Normal balance/coordination. No tremor. No cranial nerve deficit on limited exam. Motor and sensation intact and symmetric. Cerebellar reflexes intact.   Skin: warm, dry, intact. No rash/ulcer. No concerning nevi or subq nodules on limited exam.    Psychiatric: Normal judgment/insight. Normal mood and affect. Oriented x3.     No results found. Immunization History  Administered Date(s) Administered  . Influenza, High Dose Seasonal PF 06/19/2017  . Pneumococcal Conjugate-13 12/15/2015  . Pneumococcal Polysaccharide-23 06/19/2017  . Tdap 06/19/2017  . Zoster Recombinat (Shingrix) 06/02/2018   Health Maintenance  Topic Date Due  . Hepatitis C Screening  Jan 11, 1951  . INFLUENZA VACCINE  07/14/2018 (Originally 05/14/2018)  . DEXA SCAN  07/24/2018  . MAMMOGRAM  07/31/2019  . COLONOSCOPY  10/15/2023  . TETANUS/TDAP  06/20/2027  . PNA vac Low Risk Adult  Completed      ASSESSMENT/PLAN:   Annual physical exam - Plan: CBC, COMPLETE METABOLIC PANEL WITH GFR, Lipid panel  Essential hypertension - Plan: losartan (COZAAR) 50 MG tablet, CBC, COMPLETE METABOLIC PANEL WITH GFR, Lipid panel  History of osteopenia - Plan: DG Bone Density, CBC, COMPLETE METABOLIC PANEL WITH GFR, Lipid panel  Breast cancer screening - Plan: MM 3D SCREEN BREAST BILATERAL, CBC, COMPLETE METABOLIC PANEL WITH GFR, Lipid panel    Patient Instructions  General Preventive Care  Most recent routine screening lipids/other labs:   Tobacco: don't! Alcohol: moderation is ok for most people. Recreational/Illicit Drugs: don't!  Exercise: as tolerated to reduce risk of cardiovascular disease and diabetes  Mental health: if need for mental health care (medicines, counseling, other), or concerns about moods, please let me know!   Sexual health: if need for STD testing, or if pain/libido concerns,  please let me know!   Vaccines  Flu vaccine: recommended every fall (by Halloween!)  Shingles vaccine: Shingrix recommended after age 27 - you need to complete the second vaccine when due!   Pneumonia vaccines: Prevnar and Pneumovax recommended after age 54, you're up to date on these shots!   Tetanus booster: Tdap recommended every 10 years, you're up to date on this shot!  Cancer screenings   Colon cancer screening: we do not have records of your previous testing - your recollection is that it was done in 2015, and if it was normal you'd be due in 2025  Breast cancer screening: mammogram recommended annually  Cervical cancer screening: Can stop Paps at age 36 or w/ hysterectomy as long as previous testing was normal.   Infection screenings . HIV: recommended screening at least once age 42-65, more often if risk factors  . Gonorrhea/Chlamydia: screening as needed . Hepatitis C: recommended for anyone born 77-1964  Other . Bone Density Test: recommended for women at age 48, you'll be due in 07/2018 - order is in  . Advanced Directive: Living Will and/or Healthcare Power of Attorney recommended for everyone, regardless of age or health . Cholesterol & Diabetes: recommended screening annually    Visit summary  with medication list and pertinent instructions was printed for patient to review. All questions at time of visit were answered - patient instructed to contact office with any additional concerns. ER/RTC precautions were reviewed with the patient.   Follow-up plan: Return in about 6 months (around 12/21/2018) for medicare wellness visit w/ Selena Batten.    Please note: voice recognition software was used to produce this document, and typos may escape review. Please contact Dr. Lyn Hollingshead for any needed clarifications.

## 2018-06-22 NOTE — Patient Instructions (Signed)
General Preventive Care  Most recent routine screening lipids/other labs:   Tobacco: don't! Alcohol: moderation is ok for most people. Recreational/Illicit Drugs: don't!  Exercise: as tolerated to reduce risk of cardiovascular disease and diabetes  Mental health: if need for mental health care (medicines, counseling, other), or concerns about moods, please let me know!   Sexual health: if need for STD testing, or if pain/libido concerns, please let me know!   Vaccines  Flu vaccine: recommended every fall (by Halloween!)  Shingles vaccine: Shingrix recommended after age 15 - you need to complete the second vaccine when due!   Pneumonia vaccines: Prevnar and Pneumovax recommended after age 42, you're up to date on these shots!   Tetanus booster: Tdap recommended every 10 years, you're up to date on this shot!  Cancer screenings   Colon cancer screening: we do not have records of your previous testing - your recollection is that it was done in 2015, and if it was normal you'd be due in 2025  Breast cancer screening: mammogram recommended annually  Cervical cancer screening: Can stop Paps at age 59 or w/ hysterectomy as long as previous testing was normal.   Infection screenings . HIV: recommended screening at least once age 88-65, more often if risk factors  . Gonorrhea/Chlamydia: screening as needed . Hepatitis C: recommended for anyone born 45-1964  Other . Bone Density Test: recommended for women at age 77, you'll be due in 07/2018 - order is in  . Advanced Directive: Living Will and/or Healthcare Power of Attorney recommended for everyone, regardless of age or health . Cholesterol & Diabetes: recommended screening annually

## 2018-06-23 ENCOUNTER — Encounter: Payer: Self-pay | Admitting: Osteopathic Medicine

## 2018-07-09 LAB — CBC
HCT: 36 % (ref 35.0–45.0)
Hemoglobin: 12.2 g/dL (ref 11.7–15.5)
MCH: 30.9 pg (ref 27.0–33.0)
MCHC: 33.9 g/dL (ref 32.0–36.0)
MCV: 91.1 fL (ref 80.0–100.0)
MPV: 11.3 fL (ref 7.5–12.5)
PLATELETS: 218 10*3/uL (ref 140–400)
RBC: 3.95 10*6/uL (ref 3.80–5.10)
RDW: 12.7 % (ref 11.0–15.0)
WBC: 4 10*3/uL (ref 3.8–10.8)

## 2018-07-09 LAB — COMPLETE METABOLIC PANEL WITH GFR
AG Ratio: 2.1 (calc) (ref 1.0–2.5)
ALKALINE PHOSPHATASE (APISO): 52 U/L (ref 33–130)
ALT: 14 U/L (ref 6–29)
AST: 17 U/L (ref 10–35)
Albumin: 4.2 g/dL (ref 3.6–5.1)
BILIRUBIN TOTAL: 0.5 mg/dL (ref 0.2–1.2)
BUN: 18 mg/dL (ref 7–25)
CHLORIDE: 106 mmol/L (ref 98–110)
CO2: 28 mmol/L (ref 20–32)
Calcium: 9.7 mg/dL (ref 8.6–10.4)
Creat: 0.77 mg/dL (ref 0.50–0.99)
GFR, EST AFRICAN AMERICAN: 93 mL/min/{1.73_m2} (ref >=60)
GFR, Est Non African American: 80 mL/min/{1.73_m2} (ref >=60)
GLOBULIN: 2 g/dL (ref 1.9–3.7)
Glucose, Bld: 93 mg/dL (ref 65–99)
Potassium: 4.8 mmol/L (ref 3.5–5.3)
Sodium: 140 mmol/L (ref 135–146)
Total Protein: 6.2 g/dL (ref 6.1–8.1)

## 2018-07-09 LAB — LIPID PANEL
CHOLESTEROL: 192 mg/dL (ref <200)
HDL: 73 mg/dL (ref >50)
LDL CHOLESTEROL (CALC): 104 mg/dL — AB
Non-HDL Cholesterol (Calc): 119 mg/dL (calc) (ref <130)
Total CHOL/HDL Ratio: 2.6 (calc) (ref <5.0)
Triglycerides: 63 mg/dL (ref <150)

## 2018-08-11 ENCOUNTER — Ambulatory Visit: Payer: Medicare Other | Admitting: Physician Assistant

## 2018-08-11 VITALS — BP 157/82 | HR 70 | Temp 98.2°F | Wt 166.0 lb

## 2018-08-11 DIAGNOSIS — F409 Phobic anxiety disorder, unspecified: Secondary | ICD-10-CM | POA: Diagnosis not present

## 2018-08-11 DIAGNOSIS — L989 Disorder of the skin and subcutaneous tissue, unspecified: Secondary | ICD-10-CM | POA: Diagnosis not present

## 2018-08-11 MED ORDER — TRIAMCINOLONE ACETONIDE 0.5 % EX OINT: 1.0000 "application " | TOPICAL_OINTMENT | Freq: Two times a day (BID) | CUTANEOUS | 0 refills | Status: DC

## 2018-08-11 MED ORDER — DIAZEPAM 5 MG PO TABS: ORAL_TABLET | ORAL | 0 refills | Status: DC

## 2018-08-11 NOTE — Patient Instructions (Signed)
Skin Biopsy  A skin biopsy is a procedure to remove a sample of your skin (lesion) so it can be checked under a microscope. You may need a skin biopsy if you have a skin disease or abnormal changes in your skin.  Tell a health care provider about:   Any allergies you have.   All medicines you are taking, including vitamins, herbs, eye drops, creams, and over-the-counter medicines.   Any problems you or family members have had with anesthetic medicines.   Any blood disorders you have.   Any surgeries you have had.   Any medical conditions you have.  What are the risks?  Generally, this is a safe procedure. However, problems may occur, including:   Infection.   Bleeding.   Allergic reaction to medicines.   Scarring.    What happens before the procedure?   Ask your health care provider about changing or stopping your regular medicines. This is especially important if you are taking diabetes medicines or blood thinners.   Follow instructions from your health care provider about how to care for your skin.   Ask your health care provider how your biopsy site will be marked or identified.   You may be given antibiotic medicine to prevent infection.  What happens during the procedure?   To reduce your risk of infection:  ? Your health care team will wash or sanitize their hands.  ? Your skin will be washed with soap.   You may be given a medicine to help you relax (sedative).   You will be givena medicine to numb the area (local anesthetic).   The method that your health care provider will use for your skin biopsy will depend on the type of skin problem you have. Options include:  ? Shave biopsy. Your health care provider will shave away layers of your skin lesion with a sharp blade. After shaving, a gel or ointment may be used to control bleeding.  ? Punch biopsy. Your health care provider will use a tool to remove all or part of the lesion. This leaves a small hole about the width of a pencil eraser.  The area may be covered with a gel or ointment.  ? Excisional or incisional biopsy. Your health care provider will use a surgical blade to remove all or part of your lesion.   Your skin biopsy site may be closed with stitches (sutures).   A bandage (dressing) will be applied.  The procedure may vary among health care providers and hospitals.  What happens after the procedure?   Your skin sample will be sent to a laboratory for examination.   Your skin biopsy site will be watched to make sure that it stops bleeding.   Do not drive for 24 hours if you received a sedative.  This information is not intended to replace advice given to you by your health care provider. Make sure you discuss any questions you have with your health care provider.  Document Released: 11/01/2004 Document Revised: 05/26/2016 Document Reviewed: 12/28/2014  Elsevier Interactive Patient Education  2018 [employer]vier Inc[employer]

## 2018-08-11 NOTE — Progress Notes (Signed)
HPI:                                                                Carol Sanford is a 67 y.o. female who presents to [facility]: Primary Care Sports Medicine today to establish care  C/o pruritic, scaly "bump" of right lower neck area for approx 5 weeks. She thought this was a mosquito bite but became concerned when it did not resolve Has been using Hydrocortisone cream without relief. She has no personal history of skin cancer. Denies family history     Past Medical History:  Diagnosis Date  . Hypertension    Past Surgical History:  Procedure Laterality Date  . APPENDECTOMY  1965  . BREAST BIOPSY Left    benign 9 to 10 years ago   . BREAST SURGERY Left 2007   Biopsy left breast benign   . EYE SURGERY Left 04/03/2017   Cataract  . HYSTEROSCOPY  2000  . KNEE SURGERY Left 1997   Social History   Tobacco Use  . Smoking status: Never Smoker  . Smokeless tobacco: Never Used  Substance Use Topics  . Alcohol use: Yes   family history includes Alcohol abuse in her father; Heart attack in her mother; Hypertension in her father; Stroke in her mother.    ROS: negative except as noted in the HPI  Medications: Current Outpatient Medications  Medication Sig Dispense Refill  . CALCIUM PO Take 1 tablet by mouth daily.    . Glucosamine-Chondroitin 750-600 MG TABS Take 1 capsule by mouth daily.    Marland Kitchen losartan (COZAAR) 50 MG tablet Take 1 tablet (50 mg total) by mouth daily. 90 tablet 3  . Multiple Vitamin (MULTIVITAMIN) capsule Take 1 capsule by mouth.     No current facility-administered medications for this visit.    Allergies  Allergen Reactions  . Sulfa Antibiotics Itching  . Lisinopril Cough       Objective:  BP (!) 157/82   Pulse 70   Temp 98.2 F (36.8 C) (Oral)   Wt 166 lb (75.3 kg)   BMI 28.05 kg/m  Gen:  alert, not ill-appearing, no distress, appropriate for age HEENT: head normocephalic without obvious abnormality, conjunctiva and  cornea clear, trachea midline Pulm: Normal work of breathing, normal phonation Neuro: alert and oriented x 3 MSK: extremities atraumatic, normal gait and station Skin: scaly, red plaque measuring approx 2 cm x 1 cm of right medial lower neck/supraclavicular area     No results found for this or any previous visit (from the past 72 hour(s)). No results found.    Assessment and Plan: 67 y.o. female with   .[place] was seen today for skin problem.  Diagnoses and all orders for this visit:  Skin lesion of neck -     triamcinolone ointment (KENALOG) 0.5 %; Apply 1 application topically 2 (two) times daily. To affected area, avoid eyes and face  Phobia, unspecified type -     diazepam (VALIUM) 5 MG tablet; Take 1 tab PO 30 minutes prior to procedure   Recommended shave biopsy to r/o AK, SCC Patient is anxious about the procedure and was provided with Rx for Valium for pre-procedure sedation Due to sensitive location, shave bx will be performed by Dr. Benjamin Stain  Patient education and anticipatory guidance given Patient agrees with treatment plan  Levonne Hubert PA-C

## 2018-08-12 ENCOUNTER — Ambulatory Visit (INDEPENDENT_AMBULATORY_CARE_PROVIDER_SITE_OTHER): Payer: Medicare Other

## 2018-08-12 ENCOUNTER — Encounter: Payer: Self-pay | Admitting: Physician Assistant

## 2018-08-12 DIAGNOSIS — Z1231 Encounter for screening mammogram for malignant neoplasm of breast: Secondary | ICD-10-CM | POA: Diagnosis not present

## 2018-08-12 DIAGNOSIS — M8589 Other specified disorders of bone density and structure, multiple sites: Secondary | ICD-10-CM

## 2018-08-13 ENCOUNTER — Telehealth: Payer: Self-pay | Admitting: Osteopathic Medicine

## 2018-08-13 ENCOUNTER — Encounter: Payer: Self-pay | Admitting: Sports Medicine

## 2018-08-13 ENCOUNTER — Ambulatory Visit: Payer: Medicare Other | Admitting: Sports Medicine

## 2018-08-13 DIAGNOSIS — L989 Disorder of the skin and subcutaneous tissue, unspecified: Secondary | ICD-10-CM | POA: Diagnosis not present

## 2018-08-13 NOTE — Progress Notes (Signed)
Subjective:    CC: Skin lesion on neck  HPI: For the past few months this pleasant 67 year old female has noted a lesion on the base of the right side of her neck.  She was seen by her PCP, and referred to me for further evaluation and definitive treatment.  I reviewed the past medical history, family history, social history, surgical history, and allergies today and no changes were needed.  Please see the problem list section below in epic for further details.  Past Medical History: Past Medical History:  Diagnosis Date  . Hypertension    Past Surgical History: Past Surgical History:  Procedure Laterality Date  . APPENDECTOMY  1965  . BREAST BIOPSY Left    benign 9 to 10 years ago   . BREAST SURGERY Left 2007   Biopsy left breast benign   . EYE SURGERY Left 04/03/2017   Cataract  . HYSTEROSCOPY  2000  . KNEE SURGERY Left 1997   Social History: Social History   Socioeconomic History  . Marital status: Married    Spouse name: Not on file  . Number of children: Not on file  . Years of education: Not on file  . Highest education level: Not on file  Occupational History  . Not on file  Social Needs  . Financial resource strain: Not on file  . Food insecurity:    Worry: Not on file    Inability: Not on file  . Transportation needs:    Medical: Not on file    Non-medical: Not on file  Tobacco Use  . Smoking status: Never Smoker  . Smokeless tobacco: Never Used  Substance and Sexual Activity  . Alcohol use: Yes  . Drug use: No  . Sexual activity: Not Currently  Lifestyle  . Physical activity:    Days per week: Not on file    Minutes per session: Not on file  . Stress: Not on file  Relationships  . Social connections:    Talks on phone: Not on file    Gets together: Not on file    Attends religious service: Not on file    Active member of club or organization: Not on file    Attends meetings of clubs or organizations: Not on file    Relationship status: Not  on file  Other Topics Concern  . Not on file  Social History Narrative  . Not on file   Family History: Family History  Problem Relation Age of Onset  . Heart attack Mother   . Stroke Mother   . Alcohol abuse Father   . Hypertension Father    Allergies: Allergies  Allergen Reactions  . Sulfa Antibiotics Itching  . Lisinopril Cough   Medications: See med rec.  Review of Systems: No fevers, chills, night sweats, weight loss, chest pain, or shortness of breath.   Objective:    General: Well Developed, well nourished, and in no acute distress.  Neuro: Alert and oriented x3, extra-ocular muscles intact, sensation grossly intact.  HEENT: Normocephalic, atraumatic, pupils equal round reactive to light, neck supple, no masses, no lymphadenopathy, thyroid nonpalpable.  Skin: Warm and dry, there is a 1 cm lesion, variegated, slightly hyperpigmented, that appears to be an irritated seborrheic keratosis.  Because it is irregular and partially ulcerated I do think it is reasonable to do a biopsy. Cardiac: Regular rate and rhythm, no murmurs rubs or gallops, no lower extremity edema.  Respiratory: Clear to auscultation bilaterally. Not using accessory muscles, speaking in  full sentences.  Procedure: Shave biopsy of 1cm right-sided neck suspicious skin lesion Risks, benefits, and alternatives explained and consent obtained. Time out conducted. Surface prepped with alcohol. 2cc lidocaine with epinephine infiltrated in a field block. Adequate anesthesia ensured. Area prepped and draped in a sterile fashion. Excision performed with: Using a DermaBlade I made a shave into the deep dermis, taking the lesion as a whole, I then used the Hyfrecator to achieve hemostasis. Pt stable.  Impression and Recommendations:    Skin lesion of neck Initial clinical impression is irritated seborrheic keratosis but there are some malignant features so I did a full shave biopsy with aggressive hyfrecation of  the base. Return to see me as needed, pathology results to follow. ___________________________________________ Ihor Austin. Benjamin Stain, M.D., ABFM., CAQSM. Primary Care and Sports Medicine [facility]  Adjunct Professor of Family Medicine  [employer]

## 2018-08-13 NOTE — Telephone Encounter (Signed)
Pt due for [employer]. Routing for Prior Auth.

## 2018-08-13 NOTE — Telephone Encounter (Signed)
Noted! Let me know if orders needed.

## 2018-08-13 NOTE — Assessment & Plan Note (Signed)
Initial clinical impression is irritated seborrheic keratosis but there are some malignant features so I did a full shave biopsy with aggressive hyfrecation of the base. Return to see me as needed, pathology results to follow.

## 2018-08-14 NOTE — Telephone Encounter (Signed)
Filled out forms and faxed to [employer]. Waiting on insurance.

## 2018-08-19 NOTE — Telephone Encounter (Signed)
Patient wants to think on this and is not sure if she wants to go ahead with this injection. She wants to talk with PCP before doing the injection.

## 2018-10-22 ENCOUNTER — Encounter: Payer: Self-pay | Admitting: Family Medicine

## 2018-10-22 ENCOUNTER — Ambulatory Visit: Payer: Medicare Other | Admitting: Family Medicine

## 2018-10-22 VITALS — BP 153/73 | HR 87 | Temp 98.1°F | Ht 64.0 in | Wt 167.0 lb

## 2018-10-22 DIAGNOSIS — J0101 Acute recurrent maxillary sinusitis: Secondary | ICD-10-CM | POA: Diagnosis not present

## 2018-10-22 DIAGNOSIS — I10 Essential (primary) hypertension: Secondary | ICD-10-CM

## 2018-10-22 MED ORDER — PREDNISONE 10 MG PO TABS: 30.0000 mg | ORAL_TABLET | Freq: Every day | ORAL | 0 refills | Status: DC

## 2018-10-22 MED ORDER — AZITHROMYCIN 250 MG PO TABS: 250.0000 mg | ORAL_TABLET | Freq: Every day | ORAL | 0 refills | Status: DC

## 2018-10-22 NOTE — Patient Instructions (Addendum)
Thank you for coming in today. Continue the over the counter medicines.  Use azithromycin antibiotic.  Fill and take the prednisone if worsening or not better.  Ok to use over the counter nasal spray like afrin for 3 days or less.   I do recommend that when well you follow up with Dr Lyn Hollingshead for blood pressure check.  Bring you meter and your blood pressure log.    Sinusitis, Adult Sinusitis is soreness and swelling (inflammation) of your sinuses. Sinuses are hollow spaces in the bones around your face. They are located:  Around your eyes.  In the middle of your forehead.  Behind your nose.  In your cheekbones. Your sinuses and nasal passages are lined with a fluid called mucus. Mucus drains out of your sinuses. Swelling can trap mucus in your sinuses. This lets germs (bacteria, virus, or fungus) grow, which leads to infection. Most of the time, this condition is caused by a virus. What are the causes? This condition is caused by:  Allergies.  Asthma.  Germs.  Things that block your nose or sinuses.  Growths in the nose (nasal polyps).  Chemicals or irritants in the air.  Fungus (rare). What increases the risk? You are more likely to develop this condition if:  You have a weak body defense system (immune system).  You do a lot of swimming or diving.  You use nasal sprays too much.  You smoke. What are the signs or symptoms? The main symptoms of this condition are pain and a feeling of pressure around the sinuses. Other symptoms include:  Stuffy nose (congestion).  Runny nose (drainage).  Swelling and warmth in the sinuses.  Headache.  Toothache.  A cough that may get worse at night.  Mucus that collects in the throat or the back of the nose (postnasal drip).  Being unable to smell and taste.  Being very tired (fatigue).  A fever.  Sore throat.  Bad breath. How is this diagnosed? This condition is diagnosed based on:  Your symptoms.  Your  medical history.  A physical exam.  Tests to find out if your condition is short-term (acute) or long-term (chronic). Your doctor may: ? Check your nose for growths (polyps). ? Check your sinuses using a tool that has a light (endoscope). ? Check for allergies or germs. ? Do imaging tests, such as an MRI or CT scan. How is this treated? Treatment for this condition depends on the cause and whether it is short-term or long-term.  If caused by a virus, your symptoms should go away on their own within 10 days. You may be given medicines to relieve symptoms. They include: ? Medicines that shrink swollen tissue in the nose. ? Medicines that treat allergies (antihistamines). ? A spray that treats swelling of the nostrils. ? Rinses that help get rid of thick mucus in your nose (nasal saline washes).  If caused by bacteria, your doctor may wait to see if you will get better without treatment. You may be given antibiotic medicine if you have: ? A very bad infection. ? A weak body defense system.  If caused by growths in the nose, you may need to have surgery. Follow these instructions at home: Medicines  Take, use, or apply over-the-counter and prescription medicines only as told by your doctor. These may include nasal sprays.  If you were prescribed an antibiotic medicine, take it as told by your doctor. Do not stop taking the antibiotic even if you start to feel  better. Hydrate and humidify   Drink enough water to keep your pee (urine) pale yellow.  Use a cool mist humidifier to keep the humidity level in your home above 50%.  Breathe in steam for 10-15 minutes, 3-4 times a day, or as told by your doctor. You can do this in the bathroom while a hot shower is running.  Try not to spend time in cool or dry air. Rest  Rest as much as you can.  Sleep with your head raised (elevated).  Make sure you get enough sleep each night. General instructions   Put a warm, moist washcloth  on your face 3-4 times a day, or as often as told by your doctor. This will help with discomfort.  Wash your hands often with soap and water. If there is no soap and water, use hand sanitizer.  Do not smoke. Avoid being around people who are smoking (secondhand smoke).  Keep all follow-up visits as told by your doctor. This is important. Contact a doctor if:  You have a fever.  Your symptoms get worse.  Your symptoms do not get better within 10 days. Get help right away if:  You have a very bad headache.  You cannot stop throwing up (vomiting).  You have very bad pain or swelling around your face or eyes.  You have trouble seeing.  You feel confused.  Your neck is stiff.  You have trouble breathing. Summary  Sinusitis is swelling of your sinuses. Sinuses are hollow spaces in the bones around your face.  This condition is caused by tissues in your nose that become inflamed or swollen. This traps germs. These can lead to infection.  If you were prescribed an antibiotic medicine, take it as told by your doctor. Do not stop taking it even if you start to feel better.  Keep all follow-up visits as told by your doctor. This is important. This information is not intended to replace advice given to you by your health care provider. Make sure you discuss any questions you have with your health care provider. Document Released: 03/18/2008 Document Revised: 03/02/2018 Document Reviewed: 03/02/2018 Elsevier Interactive Patient Education  2019 [employer]

## 2018-10-22 NOTE — Progress Notes (Signed)
Carol Sanford is a 68 y.o. female who presents to [facility] Primary Care Sports Medicine today for congestion and sinus pressure. Carol Sanford became sick with a cold, sore throat, and body aches on 10/08/18. She had started to get better up until four days ago, when she started worsening. She has had congestion, runny nose, and difficulty breathing due to congestion. She has been using saline nasal spray and Mucinex with only mild symptom relief. Denies headaches, fevers, chest pain, N/V, and diarrhea. She has a history of sinus infections and is normally treated with antibiotics. This feels just like her typical sinus infection.  Carol Sanford also has uncontrolled HTN. She is currently on losartan, but has had elevated BP at the past several visits. Her BP is usually elevated upon home check.   ROS as above:  Exam:  BP (!) 153/73   Pulse 87   Temp 98.1 F (36.7 C) (Oral)   Ht 5\' 4"  (1.626 m)   Wt 167 lb (75.8 kg)   SpO2 100%   BMI 28.67 kg/m  Wt Readings from Last 5 Encounters:  10/22/18 167 lb (75.8 kg)  08/11/18 166 lb (75.3 kg)  06/22/18 164 lb 12.8 oz (74.8 kg)  04/14/18 165 lb (74.8 kg)  03/31/18 168 lb 6.4 oz (76.4 kg)    Gen: Unwell but nontoxic appearing HEENT: EOMI,  MMM, TM NL BL, erythematous oropharynx, erythematous nasal vestibule. Tenderness over maxillary sinuses and frontal sinuses.  Lungs: Normal work of breathing. CTABL Heart: RRR no MRG Abd: NABS, Soft. Nondistended, Nontender Exts: Brisk capillary refill, warm and well perfused.   Lab and Radiology Results    Assessment and Plan: 68 y.o. female with  Sinusitis: Carol Sanford has been feeling unwell since 12/26 with recent worsening of her symptoms over the past four days. She has a history of sinusitis, which is normally treated well with antibiotics. Prescribed azithromycin as this has worked well in the past. Recommended  using Afrin for 3 days and to continue using other OTC treatments. Provided prescription for prednisone to be using if she is worsening or not improving.   Elevated blood pressure: Carol Sanford has had elevated blood pressure at the past several visits. Recommended following up with Dr. Lyn Hollingshead and to bring blood pressure meter and log.   PDMP not reviewed this encounter. No orders of the defined types were placed in this encounter.  Meds ordered this encounter  Medications  . azithromycin (ZITHROMAX) 250 MG tablet    Sig: Take 1 tablet (250 mg total) by mouth daily. Take first 2 tablets together, then 1 every day until finished.    Dispense:  6 tablet    Refill:  0  . predniSONE (DELTASONE) 10 MG tablet    Sig: Take 3 tablets (30 mg total) by mouth daily with breakfast.    Dispense:  15 tablet    Refill:  0     Historical information moved to improve visibility of documentation.  Past Medical History:  Diagnosis Date  . Hypertension    Past Surgical History:  Procedure Laterality Date  . APPENDECTOMY  1965  . BREAST BIOPSY Left    benign 9 to 10 years ago   . BREAST SURGERY Left 2007   Biopsy left breast benign   . EYE SURGERY Left 04/03/2017   Cataract  . HYSTEROSCOPY  2000  . KNEE SURGERY Left 1997   Social History   Tobacco Use  . Smoking  status: Never Smoker  . Smokeless tobacco: Never Used  Substance Use Topics  . Alcohol use: Yes   family history includes Alcohol abuse in her father; Heart attack in her mother; Hypertension in her father; Stroke in her mother.  Medications: Current Outpatient Medications  Medication Sig Dispense Refill  . CALCIUM PO Take 1 tablet by mouth daily.    . diazepam (VALIUM) 5 MG tablet Take 1 tab PO 30 minutes prior to procedure 2 tablet 0  . Glucosamine-Chondroitin 750-600 MG TABS Take 1 capsule by mouth daily.    Marland Kitchen. losartan (COZAAR) 50 MG tablet Take 1 tablet (50 mg total) by mouth daily. 90 tablet 3  . Multiple Vitamin  (MULTIVITAMIN) capsule Take 1 capsule by mouth.    . triamcinolone ointment (KENALOG) 0.5 % Apply 1 application topically 2 (two) times daily. To affected area, avoid eyes and face 15 g 0  . azithromycin (ZITHROMAX) 250 MG tablet Take 1 tablet (250 mg total) by mouth daily. Take first 2 tablets together, then 1 every day until finished. 6 tablet 0  . predniSONE (DELTASONE) 10 MG tablet Take 3 tablets (30 mg total) by mouth daily with breakfast. 15 tablet 0   No current facility-administered medications for this visit.    Allergies  Allergen Reactions  . Sulfa Antibiotics Itching  . Lisinopril Cough     Discussed warning signs or symptoms. Please see discharge instructions. Patient expresses understanding.  I personally was present and performed or re-performed the history, physical exam and medical decision-making activities of this service and have verified that the service and findings are accurately documented in the student's note. ___________________________________________ Clementeen GrahamEvan Mittie Knittel M.D., ABFM., CAQSM. Primary Care and Sports Medicine Adjunct Instructor of Family Medicine  [employer]

## 2019-02-05 ENCOUNTER — Telehealth: Payer: Self-pay | Admitting: Osteopathic Medicine

## 2019-02-05 NOTE — Telephone Encounter (Signed)
Received a fax from [employer] that Prolia is covered and sent message for patient to let me know if she wants to proceed.

## 2019-03-24 ENCOUNTER — Encounter: Payer: Self-pay | Admitting: Family Medicine

## 2019-03-24 ENCOUNTER — Telehealth (INDEPENDENT_AMBULATORY_CARE_PROVIDER_SITE_OTHER): Payer: Medicare Other | Admitting: Family Medicine

## 2019-03-24 ENCOUNTER — Telehealth: Payer: Self-pay | Admitting: Hematology

## 2019-03-24 ENCOUNTER — Other Ambulatory Visit: Payer: Medicare Other

## 2019-03-24 VITALS — BP 144/77 | HR 76 | Temp 98.2°F | Wt 167.0 lb

## 2019-03-24 DIAGNOSIS — Z20822 Contact with and (suspected) exposure to covid-19: Secondary | ICD-10-CM

## 2019-03-24 DIAGNOSIS — R6889 Other general symptoms and signs: Secondary | ICD-10-CM | POA: Diagnosis not present

## 2019-03-24 DIAGNOSIS — J069 Acute upper respiratory infection, unspecified: Secondary | ICD-10-CM

## 2019-03-24 NOTE — Telephone Encounter (Signed)
-----   Message from Gregor Hams, MD sent at 03/24/2019  2:23 PM EDT ----- Regarding: Needs COVID-19 test Please contact Charlann Lange to arrange for outpatient testing for COVID-19

## 2019-03-24 NOTE — Telephone Encounter (Signed)
Order placed and patient scheduled / Patient referred by Dr. Georgina Snell. / Patient instructed that results would come to my chart.  If positive a provider will call her. / Patient verbalizes understanding /

## 2019-03-24 NOTE — Progress Notes (Signed)
Virtual Visit  via Video Note  I connected with      Carol Sanford by a video enabled telemedicine application and verified that I am speaking with the correct person using two identifiers.   I discussed the limitations of evaluation and management by telemedicine and the availability of in person appointments. The patient expressed understanding and agreed to proceed.  History of Present Illness: Carol Sanford is a 68 y.o. female who would like to discuss sore throat.    Starting Monday, June 8 [name] developed a mild sore throat body aches and fatigue.  She denies any fever cough congestion shortness of breath.  No vomiting diarrhea chest pain or palpitation.  She is also denies any tick bites.  She is tried some salt water gargle which helped.  She notes that she had run out of Claritin over the weekend and started it again last night and is not sure if her symptoms are sinus related or allergy related.  She notes that she visited her family in [place] over the weekend but denies any other exposure potential.  Her family members are not sick and she denies any known COVID-19 exposure.  Observations/Objective: BP (!) 144/77   Pulse 76   Temp 98.2 F (36.8 C) (Oral)   Wt 167 lb (75.8 kg)   BMI 28.67 kg/m  Wt Readings from Last 5 Encounters:  03/24/19 167 lb (75.8 kg)  10/22/18 167 lb (75.8 kg)  08/11/18 166 lb (75.3 kg)  06/22/18 164 lb 12.8 oz (74.8 kg)  04/14/18 165 lb (74.8 kg)   Exam: Appearance nontoxic no acute distress Normal Speech.    Lab and Radiology Results No results found for this or any previous visit (from the past 72 hour(s)). No results found.   Assessment and Plan: 68 y.o. female with possible COVID-19 versus viral URI symptoms.  Plan for outpatient COVID-19 test.  Reasonable to continue over-the-counter medication therapy.  Watchful waiting recheck as needed.  Recommend social isolation until symptoms improve or test result negative.  PDMP not  reviewed this encounter. No orders of the defined types were placed in this encounter.  No orders of the defined types were placed in this encounter.   Follow Up Instructions:    I discussed the assessment and treatment plan with the patient. The patient was provided an opportunity to ask questions and all were answered. The patient agreed with the plan and demonstrated an understanding of the instructions.   The patient was advised to call back or seek an in-person evaluation if the symptoms worsen or if the condition fails to improve as anticipated.  Time: 15 minutes of intraservice time, with >22 minutes of total time during today's visit.      Historical information moved to improve visibility of documentation.  Past Medical History:  Diagnosis Date  . Hypertension    Past Surgical History:  Procedure Laterality Date  . APPENDECTOMY  1965  . BREAST BIOPSY Left    benign 9 to 10 years ago   . BREAST SURGERY Left 2007   Biopsy left breast benign   . EYE SURGERY Left 04/03/2017   Cataract  . HYSTEROSCOPY  2000  . KNEE SURGERY Left 1997   Social History   Tobacco Use  . Smoking status: Never Smoker  . Smokeless tobacco: Never Used  Substance Use Topics  . Alcohol use: Yes   family history includes Alcohol abuse in her father; Heart attack in her mother; Hypertension in her father;  Stroke in her mother.  Medications: Current Outpatient Medications  Medication Sig Dispense Refill  . Glucosamine-Chondroitin 750-600 MG TABS Take 1 capsule by mouth daily.    Marland Kitchen. losartan (COZAAR) 50 MG tablet Take 1 tablet (50 mg total) by mouth daily. 90 tablet 3  . Multiple Vitamin (MULTIVITAMIN) capsule Take 1 capsule by mouth.     No current facility-administered medications for this visit.    Allergies  Allergen Reactions  . Sulfa Antibiotics Itching  . Lisinopril Cough

## 2019-03-25 LAB — NOVEL CORONAVIRUS, NAA: SARS-CoV-2, NAA: NOT DETECTED

## 2019-06-29 ENCOUNTER — Ambulatory Visit (INDEPENDENT_AMBULATORY_CARE_PROVIDER_SITE_OTHER): Payer: Medicare Other | Admitting: Osteopathic Medicine

## 2019-06-29 VITALS — BP 146/79 | HR 80 | Temp 98.8°F | Wt 167.0 lb

## 2019-06-29 DIAGNOSIS — M8580 Other specified disorders of bone density and structure, unspecified site: Secondary | ICD-10-CM

## 2019-06-29 DIAGNOSIS — Z0000 Encounter for general adult medical examination without abnormal findings: Secondary | ICD-10-CM

## 2019-06-29 NOTE — Patient Instructions (Addendum)
General Preventive Care  Routine labs ordered today  Blood pressure: goal 130/80 or less, medicines if 140/90 or over  Tobacco: don't!   Alcohol: responsible moderation is ok for most adults - if you have concerns about your alcohol intake, please talk to me!   Exercise: as tolerated to reduce risk of cardiovascular disease and diabetes. Strength training will also prevent osteoporosis.   Mental health: if need for mental health care (medicines, counseling, other), or concerns about moods, please let me know!   Sexual health: if need for STD testing, or if concerns with libido/pain problems, please let me know!   Advanced Directive: Living Will and/or Healthcare Power of Attorney recommended for all adults, regardless of age or health.  Vaccines  Flu vaccine: recommended for almost everyone, every fall.   Shingles vaccine: Shingrix all done!   Pneumonia: all done   Tetanus booster: Tdap recommended every 10 years. Good until 2028 Cancer screenings   Colon cancer screening: previous testing not on file, but you reported normal colonoscopy in 2015, would be due then to repeat around 2025.   Breast cancer screening: mammogram recommended annually after age 71.   Cervical cancer screening: Can usually stop Pap at age 31 or w/ hysterectomy.   Lung cancer screening: not needed for non-smokers  Infection screenings . HIV, Gonorrhea/Chlamydia: screening as needed . Hepatitis C: recommended for anyone born 36-1964 . TB: as needed  Other . Bone Density Test: due 2021

## 2019-06-29 NOTE — Progress Notes (Signed)
HPI: Carol Sanford is a 68 y.o. female who  has a past medical history of Hypertension.  she presents to [facility] today, 06/29/19,  for chief complaint of: Annual physical   Patient here for annual physical / wellness exam.  See preventive care reviewed as below.    Additional concerns today include:  None     Past medical, surgical, social and family history reviewed:  Patient Active Problem List   Diagnosis Date Noted  . Skin lesion of neck 08/11/2018  . Plantar fasciitis, left 06/19/2017  . History of osteopenia 06/19/2017  . Hypertension   . PVD (posterior vitreous detachment), left eye 12/06/2013  . Regular astigmatism 12/06/2013  . Senile incipient cataract 12/06/2013    Past Surgical History:  Procedure Laterality Date  . APPENDECTOMY  1965  . BREAST BIOPSY Left    benign 9 to 10 years ago   . BREAST SURGERY Left 2007   Biopsy left breast benign   . EYE SURGERY Left 04/03/2017   Cataract  . HYSTEROSCOPY  2000  . KNEE SURGERY Left 1997    Social History   Tobacco Use  . Smoking status: Never Smoker  . Smokeless tobacco: Never Used  Substance Use Topics  . Alcohol use: Yes    Family History  Problem Relation Age of Onset  . Heart attack Mother   . Stroke Mother   . Alcohol abuse Father   . Hypertension Father      Current medication list and allergy/intolerance information reviewed:    Current Outpatient Medications  Medication Sig Dispense Refill  . Glucosamine-Chondroitin 750-600 MG TABS Take 1 capsule by mouth daily.    Marland Kitchen losartan (COZAAR) 50 MG tablet Take 1 tablet (50 mg total) by mouth daily. 90 tablet 3  . Multiple Vitamin (MULTIVITAMIN) capsule Take 1 capsule by mouth.     No current facility-administered medications for this visit.     Allergies  Allergen Reactions  . Sulfa Antibiotics Itching  . Lisinopril Cough      Review of Systems:  Constitutional:  No  fever, no chills, No recent  illness, No unintentional weight changes. No significant fatigue.   HEENT: No  headache, no vision change, no hearing change, No sore throat, No  sinus pressure  Cardiac: No  chest pain, No  pressure, No palpitations, No  Orthopnea  Respiratory:  No  shortness of breath. No  Cough  Gastrointestinal: No  abdominal pain, No  nausea, No  vomiting,  No  blood in stool, No  diarrhea, No  constipation   Musculoskeletal: No new myalgia/arthralgia  Skin: No  Rash, No other wounds/concerning lesions  Genitourinary: No  incontinence, No  abnormal genital bleeding, No abnormal genital discharge  Hem/Onc: No  easy bruising/bleeding, No  abnormal lymph node  Endocrine: No cold intolerance,  No heat intolerance. No polyuria/polydipsia/polyphagia   Neurologic: No  weakness, No  dizziness, No  slurred speech/focal weakness/facial droop  Psychiatric: No  concerns with depression, No  concerns with anxiety, No sleep problems, No mood problems  Exam:  BP (!) 146/79 (BP Location: Left Arm, Patient Position: Sitting, Cuff Size: Normal)   Pulse 80   Temp 98.8 F (37.1 C) (Oral)   Wt 167 lb (75.8 kg)   BMI 28.67 kg/m   Constitutional: VS see above. General Appearance: alert, well-developed, well-nourished, NAD  Eyes: Normal lids and conjunctive, non-icteric sclera  Ears, Nose, Mouth, Throat: MMM, Normal external inspection ears/nares/mouth/lips/gums. TM normal bilaterally. Pharynx/tonsils  no erythema, no exudate. Nasal mucosa normal.   Neck: No masses, trachea midline. No thyroid enlargement. No tenderness/mass appreciated. No lymphadenopathy  Respiratory: Normal respiratory effort. no wheeze, no rhonchi, no rales  Cardiovascular: S1/S2 normal, no murmur, no rub/gallop auscultated. RRR. No lower extremity edema.   Gastrointestinal: Nontender, no masses. No hepatomegaly, no splenomegaly. No hernia appreciated. Bowel sounds normal. Rectal exam deferred.   Musculoskeletal: Gait normal. No  clubbing/cyanosis of digits.   Neurological: Normal balance/coordination. No tremor. No cranial nerve deficit on limited exam. Motor and sensation intact and symmetric. Cerebellar reflexes intact.   Skin: warm, dry, intact. No rash/ulcer. No concerning nevi or subq nodules on limited exam.    Psychiatric: Normal judgment/insight. Normal mood and affect. Oriented x3.     No results found. Immunization History  Administered Date(s) Administered  . Influenza, High Dose Seasonal PF 06/19/2017  . Pneumococcal Conjugate-13 12/15/2015  . Pneumococcal Polysaccharide-23 06/19/2017  . Tdap 06/19/2017  . Zoster Recombinat (Shingrix) 06/02/2018   Health Maintenance  Topic Date Due  . Hepatitis C Screening  1951/05/06  . INFLUENZA VACCINE  05/15/2019  . MAMMOGRAM  08/12/2020  . DEXA SCAN  08/12/2020  . COLONOSCOPY  10/15/2023  . TETANUS/TDAP  06/20/2027  . PNA vac Low Risk Adult  Completed   BP Readings from Last 3 Encounters:  03/24/19 (!) 144/77  10/22/18 (!) 153/73  08/13/18 133/74   Wt Readings from Last 3 Encounters:  03/24/19 167 lb (75.8 kg)  10/22/18 167 lb (75.8 kg)  08/11/18 166 lb (75.3 kg)       ASSESSMENT/PLAN:   Annual physical exam - Plan: CBC, COMPLETE METABOLIC PANEL WITH GFR, Lipid panel, MM 3D SCREEN BREAST BILATERAL    The 10-year ASCVD risk score Mikey Bussing DC Jr., et al., 2013) is: 10.3%   Values used to calculate the score:     Age: 77 years     Sex: Female     Is Non-Hispanic African American: No     Diabetic: No     Tobacco smoker: No     Systolic Blood Pressure: 914 mmHg     Is BP treated: Yes     HDL Cholesterol: 73 mg/dL     Total Cholesterol: 192 mg/dL  Patient Instructions  General Preventive Care  Routine labs ordered today  Blood pressure: goal 130/80 or less, medicines if 140/90 or over  Tobacco: don't!   Alcohol: responsible moderation is ok for most adults - if you have concerns about your alcohol intake, please talk to me!    Exercise: as tolerated to reduce risk of cardiovascular disease and diabetes. Strength training will also prevent osteoporosis.   Mental health: if need for mental health care (medicines, counseling, other), or concerns about moods, please let me know!   Sexual health: if need for STD testing, or if concerns with libido/pain problems, please let me know!   Advanced Directive: Living Will and/or Healthcare Power of Attorney recommended for all adults, regardless of age or health.  Vaccines  Flu vaccine: recommended for almost everyone, every fall.   Shingles vaccine: Shingrix all done!   Pneumonia: all done   Tetanus booster: Tdap recommended every 10 years. Good until 2028 Cancer screenings   Colon cancer screening: previous testing not on file, but you reported normal colonoscopy in 2015, would be due then to repeat around 2025.   Breast cancer screening: mammogram recommended annually after age 44.   Cervical cancer screening: Can usually stop Pap at age 33 or  w/ hysterectomy.   Lung cancer screening: not needed for non-smokers  Infection screenings . HIV, Gonorrhea/Chlamydia: screening as needed . Hepatitis C: recommended for anyone born [id number] . TB: as needed  Other . Bone Density Test: due 2021      Immunization History  Administered Date(s) Administered  . Influenza, High Dose Seasonal PF 06/19/2017, 06/16/2019  . Influenza, Quadrivalent, Recombinant, Inj, Pf 07/09/2018  . Pneumococcal Conjugate-13 12/15/2015  . Pneumococcal Polysaccharide-23 06/19/2017  . Tdap 06/19/2017  . Zoster Recombinat (Shingrix) 06/02/2018, 09/22/2018     Visit summary with medication list and pertinent instructions was printed for patient to review. All questions at time of visit were answered - patient instructed to contact office with any additional concerns. ER/RTC precautions were reviewed with the patient.   Follow-up plan: Return in about 1 year (around 06/28/2020) for  [place] (call week prior to visit for lab orders).    Please note: voice recognition software was used to produce this document, and typos may escape review. Please contact Dr. Lyn HollingsheadAlexander for any needed clarifications.

## 2019-08-18 ENCOUNTER — Ambulatory Visit (INDEPENDENT_AMBULATORY_CARE_PROVIDER_SITE_OTHER): Payer: Medicare Other

## 2019-08-18 ENCOUNTER — Other Ambulatory Visit: Payer: Self-pay

## 2019-08-18 DIAGNOSIS — Z1231 Encounter for screening mammogram for malignant neoplasm of breast: Secondary | ICD-10-CM | POA: Diagnosis not present

## 2019-08-19 LAB — COMPLETE METABOLIC PANEL WITH GFR
AG Ratio: 2 (calc) (ref 1.0–2.5)
ALT: 17 U/L (ref 6–29)
AST: 18 U/L (ref 10–35)
Albumin: 4.5 g/dL (ref 3.6–5.1)
Alkaline phosphatase (APISO): 48 U/L (ref 37–153)
BUN: 15 mg/dL (ref 7–25)
CO2: 28 mmol/L (ref 20–32)
Calcium: 9.4 mg/dL (ref 8.6–10.4)
Chloride: 104 mmol/L (ref 98–110)
Creat: 0.72 mg/dL (ref 0.50–0.99)
GFR, Est African American: 100 mL/min/{1.73_m2} (ref >=60)
GFR, Est Non African American: 87 mL/min/{1.73_m2} (ref >=60)
Globulin: 2.2 g/dL (calc) (ref 1.9–3.7)
Glucose, Bld: 99 mg/dL (ref 65–99)
Potassium: 4.5 mmol/L (ref 3.5–5.3)
Sodium: 138 mmol/L (ref 135–146)
Total Bilirubin: 0.7 mg/dL (ref 0.2–1.2)
Total Protein: 6.7 g/dL (ref 6.1–8.1)

## 2019-08-19 LAB — CBC
HCT: 36.5 % (ref 35.0–45.0)
Hemoglobin: 12.3 g/dL (ref 11.7–15.5)
MCH: 31 pg (ref 27.0–33.0)
MCHC: 33.7 g/dL (ref 32.0–36.0)
MCV: 91.9 fL (ref 80.0–100.0)
MPV: 10.7 fL (ref 7.5–12.5)
Platelets: 210 10*3/uL (ref 140–400)
RBC: 3.97 10*6/uL (ref 3.80–5.10)
RDW: 12.5 % (ref 11.0–15.0)
WBC: 2.8 10*3/uL — ABNORMAL LOW (ref 3.8–10.8)

## 2019-08-19 LAB — LIPID PANEL
Cholesterol: 224 mg/dL — ABNORMAL HIGH (ref <200)
HDL: 84 mg/dL (ref >=50)
LDL Cholesterol (Calc): 123 mg/dL (calc) — ABNORMAL HIGH
Non-HDL Cholesterol (Calc): 140 mg/dL (calc) — ABNORMAL HIGH (ref <130)
Total CHOL/HDL Ratio: 2.7 (calc) (ref <5.0)
Triglycerides: 75 mg/dL (ref <150)

## 2019-08-19 LAB — VITAMIN D 25 HYDROXY (VIT D DEFICIENCY, FRACTURES): Vit D, 25-Hydroxy: 66 ng/mL (ref 30–100)

## 2019-08-20 ENCOUNTER — Other Ambulatory Visit: Payer: Self-pay | Admitting: Osteopathic Medicine

## 2019-08-20 DIAGNOSIS — D72819 Decreased white blood cell count, unspecified: Secondary | ICD-10-CM

## 2019-08-23 ENCOUNTER — Telehealth: Payer: Self-pay | Admitting: Osteopathic Medicine

## 2019-08-23 NOTE — Telephone Encounter (Signed)
I had re-faxed the order on 08/20/2019 and the lab received the confirmation that the lab was not able to create slide at this time. Per Maudie Mercury the lab can discard the blood in as soon as 48 hours. Please advise.

## 2019-08-24 NOTE — Telephone Encounter (Signed)
Please call patient and let her know we tried to add the test on to the blood already drawn, but the lab was unable to do this.  Orders in place, she can just come to the lab downstairs at her convenience for additional blood draw, apologies for the inconvenience

## 2019-08-24 NOTE — Telephone Encounter (Signed)
Left a detailed vm msg for pt regarding provider's note. Aware to have additional labs completed at their convenience. Direct call back info provided.

## 2019-08-25 LAB — CBC (INCLUDES DIFF/PLT) WITH PATHOLOGIST REVIEW
Absolute Monocytes: 270 cells/uL (ref 200–950)
Basophils Absolute: 40 cells/uL (ref 0–200)
Basophils Relative: 1.1 %
Eosinophils Absolute: 101 cells/uL (ref 15–500)
Eosinophils Relative: 2.8 %
HCT: 35.8 % (ref 35.0–45.0)
Hemoglobin: 12.3 g/dL (ref 11.7–15.5)
Lymphs Abs: 900 cells/uL (ref 850–3900)
MCH: 31.5 pg (ref 27.0–33.0)
MCHC: 34.4 g/dL (ref 32.0–36.0)
MCV: 91.8 fL (ref 80.0–100.0)
MPV: 11.2 fL (ref 7.5–12.5)
Monocytes Relative: 7.5 %
Neutro Abs: 2290 cells/uL (ref 1500–7800)
Neutrophils Relative %: 63.6 %
Platelets: 216 10*3/uL (ref 140–400)
RBC: 3.9 10*6/uL (ref 3.80–5.10)
RDW: 12.3 % (ref 11.0–15.0)
Total Lymphocyte: 25 %
WBC: 3.6 10*3/uL — ABNORMAL LOW (ref 3.8–10.8)

## 2019-08-31 ENCOUNTER — Other Ambulatory Visit: Payer: Self-pay | Admitting: Osteopathic Medicine

## 2019-08-31 DIAGNOSIS — I10 Essential (primary) hypertension: Secondary | ICD-10-CM

## 2019-11-12 ENCOUNTER — Ambulatory Visit: Payer: Medicare Other

## 2019-11-29 ENCOUNTER — Ambulatory Visit: Payer: Medicare Other

## 2019-12-06 ENCOUNTER — Ambulatory Visit: Payer: Medicare Other

## 2020-05-26 ENCOUNTER — Other Ambulatory Visit: Payer: Self-pay | Admitting: Osteopathic Medicine

## 2020-05-26 DIAGNOSIS — I10 Essential (primary) hypertension: Secondary | ICD-10-CM

## 2020-06-29 ENCOUNTER — Other Ambulatory Visit: Payer: Self-pay

## 2020-06-29 ENCOUNTER — Encounter: Payer: Self-pay | Admitting: Osteopathic Medicine

## 2020-06-29 ENCOUNTER — Ambulatory Visit (INDEPENDENT_AMBULATORY_CARE_PROVIDER_SITE_OTHER): Payer: Medicare PPO | Admitting: Osteopathic Medicine

## 2020-06-29 VITALS — BP 132/84 | HR 78 | Temp 98.0°F | Wt 165.0 lb

## 2020-06-29 DIAGNOSIS — I10 Essential (primary) hypertension: Secondary | ICD-10-CM

## 2020-06-29 DIAGNOSIS — Z13820 Encounter for screening for osteoporosis: Secondary | ICD-10-CM | POA: Diagnosis not present

## 2020-06-29 DIAGNOSIS — Z23 Encounter for immunization: Secondary | ICD-10-CM

## 2020-06-29 DIAGNOSIS — Z1231 Encounter for screening mammogram for malignant neoplasm of breast: Secondary | ICD-10-CM

## 2020-06-29 DIAGNOSIS — Z0000 Encounter for general adult medical examination without abnormal findings: Secondary | ICD-10-CM

## 2020-06-29 NOTE — Progress Notes (Signed)
Carol Sanford is a 69 y.o. female who presents to  [facility]  today, 06/29/20, seeking care for the following:  . Annual physical  . Occasional epigastric abdominal pain when turning, sharp momentary pain resolves, hasn't experienced this in a few weeks      ASSESSMENT & PLAN with other pertinent findings:  The primary encounter diagnosis was Annual physical exam. Diagnoses of Essential hypertension, Encounter for screening mammogram for malignant neoplasm of breast, and Screening for osteoporosis were also pertinent to this visit.   No results found for this or any previous visit (from the past 24 hour(s)).  Abdominal pain - benign exam. Sounds like diaphragmatic spasm, possible abdominal wall muscle / intercostal pain. Precautions reviewed    Patient Instructions  General Preventive Care  Most recent routine screening labs: ordered today.   Blood pressure goal 130/80 or less.   Tobacco: don't!   Alcohol: responsible moderation is ok for most adults - if you have concerns about your alcohol intake, please talk to me!   Exercise: as tolerated to reduce risk of cardiovascular disease and diabetes. Strength training will also prevent osteoporosis.   Mental health: if need for mental health care (medicines, counseling, other), or concerns about moods, please let me know!   Sexual / Reproductive health: if need for STD testing, or if concerns with libido/pain problems, please let me know!  Advanced Directive: Living Will and/or Healthcare Power of Attorney recommended for all adults, regardless of age or health.  Vaccines  Flu vaccine: every fall.   Shingles vaccine: done.   Pneumonia vaccines: done  Tetanus booster: every 10 years - due 06/2027  COVID vaccine: THANKS for getting your vaccine! :)  Cancer screenings   Colon cancer screening: for everyone age 12-75. Reported done 2015  Breast cancer screening:  mammogram annually age 85-75. Due 08/2020, ordered  Cervical cancer screening: Can stop Paps at age 55 if normal   Lung cancer screening: not needed for non-smokers  Infection screenings  . HIV: recommended screening at least once age 41-65 . Gonorrhea/Chlamydia: screening as needed . Hepatitis C: recommended once for everyone age 25-75 . TB: certain at-risk populations Other . Bone Density Test: due 07/2020, ordered   Orders Placed This Encounter  Procedures  . MM 3D SCREEN BREAST BILATERAL  . DG Bone Density  . Flu Vaccine QUAD High Dose(Fluad)  . CBC with Differential/Platelet  . COMPLETE METABOLIC PANEL WITH GFR  . Lipid panel    No orders of the defined types were placed in this encounter.      Follow-up instructions: Return in about 1 year (around 06/29/2021) for [employer].                                         BP 132/84 (BP Location: Left Arm, Patient Position: Sitting)   Pulse 78   Temp 98 F (36.7 C)   Wt 165 lb (74.8 kg)   SpO2 98%   PF 99 L/min   BMI 28.32 kg/m   Constitutional:  . VSS, see nurse notes . General Appearance: alert, well-developed, well-nourished, NAD Eyes: Marland Kitchen Normal lids and conjunctive, non-icteric sclera Neck: . No masses, trachea midline . No thyroid enlargement/tenderness/mass appreciated Respiratory: . Normal respiratory effort . Breath sounds normal, no wheeze/rhonchi/rales Cardiovascular: . S1/S2 normal, no murmur/rub/gallop auscultated, RRR . No lower extremity edema  Gastrointestinal: . Nontender, no masses . No hepatomegaly, no splenomegaly . No hernia appreciated Musculoskeletal:  . Gait normal . No clubbing/cyanosis of digits Neurological: . No cranial nerve deficit on limited exam . Motor and sensation intact and symmetric Psychiatric: . Normal judgment/insight . Normal mood and affect    Current Meds  Medication Sig  . B Complex-C (B-COMPLEX WITH VITAMIN C)  tablet   . Fexofenadine HCl (MUCINEX ALLERGY PO) Take by mouth.  . Glucos-Chond-Hyal Ac-Ca Fructo (MOVE FREE JOINT HEALTH ADVANCE) TABS   . Glucosamine-Chondroitin 750-600 MG TABS Take 1 capsule by mouth daily.  . Loratadine (CLARITIN PO) Take by mouth.  . losartan (COZAAR) 50 MG tablet TAKE ONE TABLET BY MOUTH DAILY  . Vitamin D-Vitamin K (VITAMIN K2-VITAMIN D3 PO)     No results found for this or any previous visit (from the past 72 hour(s)).  No results found.     All questions at time of visit were answered - patient instructed to contact office with any additional concerns or updates.  ER/RTC precautions were reviewed with the patient as applicable.   Please note: voice recognition software was used to produce this document, and typos may escape review. Please contact Dr. Lyn Hollingshead for any needed clarifications.

## 2020-06-29 NOTE — Patient Instructions (Addendum)
General Preventive Care  Most recent routine screening labs: ordered today.   Blood pressure goal 130/80 or less.   Tobacco: don't!   Alcohol: responsible moderation is ok for most adults - if you have concerns about your alcohol intake, please talk to me!   Exercise: as tolerated to reduce risk of cardiovascular disease and diabetes. Strength training will also prevent osteoporosis.   Mental health: if need for mental health care (medicines, counseling, other), or concerns about moods, please let me know!   Sexual / Reproductive health: if need for STD testing, or if concerns with libido/pain problems, please let me know!  Advanced Directive: Living Will and/or Healthcare Power of Attorney recommended for all adults, regardless of age or health.  Vaccines  Flu vaccine: every fall.   Shingles vaccine: done.   Pneumonia vaccines: done  Tetanus booster: every 10 years - due 06/2027  COVID vaccine: THANKS for getting your vaccine! :)  Cancer screenings   Colon cancer screening: for everyone age 44-75. Reported done 2015  Breast cancer screening: mammogram annually age 48-75. Due 08/2020, ordered  Cervical cancer screening: Can stop Paps at age 71 if normal   Lung cancer screening: not needed for non-smokers  Infection screenings  . HIV: recommended screening at least once age 29-65 . Gonorrhea/Chlamydia: screening as needed . Hepatitis C: recommended once for everyone age 43-75 . TB: certain at-risk populations Other . Bone Density Test: due 07/2020, ordered

## 2020-07-06 DIAGNOSIS — Z0000 Encounter for general adult medical examination without abnormal findings: Secondary | ICD-10-CM | POA: Diagnosis not present

## 2020-07-06 DIAGNOSIS — I10 Essential (primary) hypertension: Secondary | ICD-10-CM | POA: Diagnosis not present

## 2020-07-06 LAB — CBC WITH DIFFERENTIAL/PLATELET
Absolute Monocytes: 294 cells/uL (ref 200–950)
Basophils Absolute: 42 cells/uL (ref 0–200)
Basophils Relative: 1.2 %
Eosinophils Absolute: 203 cells/uL (ref 15–500)
Eosinophils Relative: 5.8 %
HCT: 36.4 % (ref 35.0–45.0)
Hemoglobin: 12.4 g/dL (ref 11.7–15.5)
Lymphs Abs: 798 cells/uL — ABNORMAL LOW (ref 850–3900)
MCH: 31.8 pg (ref 27.0–33.0)
MCHC: 34.1 g/dL (ref 32.0–36.0)
MCV: 93.3 fL (ref 80.0–100.0)
MPV: 11 fL (ref 7.5–12.5)
Monocytes Relative: 8.4 %
Neutro Abs: 2163 cells/uL (ref 1500–7800)
Neutrophils Relative %: 61.8 %
Platelets: 226 10*3/uL (ref 140–400)
RBC: 3.9 10*6/uL (ref 3.80–5.10)
RDW: 12.3 % (ref 11.0–15.0)
Total Lymphocyte: 22.8 %
WBC: 3.5 10*3/uL — ABNORMAL LOW (ref 3.8–10.8)

## 2020-07-06 LAB — LIPID PANEL
Cholesterol: 186 mg/dL (ref <200)
HDL: 72 mg/dL (ref >=50)
LDL Cholesterol (Calc): 97 mg/dL (calc)
Non-HDL Cholesterol (Calc): 114 mg/dL (calc) (ref <130)
Total CHOL/HDL Ratio: 2.6 (calc) (ref <5.0)
Triglycerides: 77 mg/dL (ref <150)

## 2020-07-06 LAB — COMPLETE METABOLIC PANEL WITH GFR
AG Ratio: 2.2 (calc) (ref 1.0–2.5)
ALT: 14 U/L (ref 6–29)
AST: 15 U/L (ref 10–35)
Albumin: 4.4 g/dL (ref 3.6–5.1)
Alkaline phosphatase (APISO): 46 U/L (ref 37–153)
BUN: 14 mg/dL (ref 7–25)
CO2: 26 mmol/L (ref 20–32)
Calcium: 9.5 mg/dL (ref 8.6–10.4)
Chloride: 106 mmol/L (ref 98–110)
Creat: 0.79 mg/dL (ref 0.50–0.99)
GFR, Est African American: 89 mL/min/{1.73_m2} (ref >=60)
GFR, Est Non African American: 77 mL/min/{1.73_m2} (ref >=60)
Globulin: 2 g/dL (calc) (ref 1.9–3.7)
Glucose, Bld: 95 mg/dL (ref 65–99)
Potassium: 4.7 mmol/L (ref 3.5–5.3)
Sodium: 139 mmol/L (ref 135–146)
Total Bilirubin: 0.7 mg/dL (ref 0.2–1.2)
Total Protein: 6.4 g/dL (ref 6.1–8.1)

## 2020-07-26 DIAGNOSIS — H26493 Other secondary cataract, bilateral: Secondary | ICD-10-CM | POA: Diagnosis not present

## 2020-07-26 DIAGNOSIS — H04123 Dry eye syndrome of bilateral lacrimal glands: Secondary | ICD-10-CM | POA: Diagnosis not present

## 2020-07-26 DIAGNOSIS — H02834 Dermatochalasis of left upper eyelid: Secondary | ICD-10-CM | POA: Diagnosis not present

## 2020-07-26 DIAGNOSIS — H353131 Nonexudative age-related macular degeneration, bilateral, early dry stage: Secondary | ICD-10-CM | POA: Diagnosis not present

## 2020-07-26 DIAGNOSIS — H527 Unspecified disorder of refraction: Secondary | ICD-10-CM | POA: Diagnosis not present

## 2020-07-26 DIAGNOSIS — H43813 Vitreous degeneration, bilateral: Secondary | ICD-10-CM | POA: Diagnosis not present

## 2020-07-26 DIAGNOSIS — H02831 Dermatochalasis of right upper eyelid: Secondary | ICD-10-CM | POA: Diagnosis not present

## 2020-08-23 ENCOUNTER — Ambulatory Visit (INDEPENDENT_AMBULATORY_CARE_PROVIDER_SITE_OTHER): Payer: Medicare PPO

## 2020-08-23 ENCOUNTER — Other Ambulatory Visit: Payer: Self-pay

## 2020-08-23 DIAGNOSIS — Z13820 Encounter for screening for osteoporosis: Secondary | ICD-10-CM

## 2020-08-23 DIAGNOSIS — M810 Age-related osteoporosis without current pathological fracture: Secondary | ICD-10-CM | POA: Diagnosis not present

## 2020-08-23 DIAGNOSIS — Z780 Asymptomatic menopausal state: Secondary | ICD-10-CM | POA: Diagnosis not present

## 2020-08-23 DIAGNOSIS — Z1231 Encounter for screening mammogram for malignant neoplasm of breast: Secondary | ICD-10-CM

## 2020-08-23 DIAGNOSIS — Z8739 Personal history of other diseases of the musculoskeletal system and connective tissue: Secondary | ICD-10-CM | POA: Diagnosis not present

## 2020-08-27 ENCOUNTER — Other Ambulatory Visit: Payer: Self-pay | Admitting: Osteopathic Medicine

## 2020-08-27 DIAGNOSIS — I10 Essential (primary) hypertension: Secondary | ICD-10-CM

## 2020-11-01 DIAGNOSIS — Z20822 Contact with and (suspected) exposure to covid-19: Secondary | ICD-10-CM | POA: Diagnosis not present

## 2021-03-05 ENCOUNTER — Ambulatory Visit (INDEPENDENT_AMBULATORY_CARE_PROVIDER_SITE_OTHER): Payer: Medicare PPO | Admitting: Osteopathic Medicine

## 2021-03-05 DIAGNOSIS — Z1231 Encounter for screening mammogram for malignant neoplasm of breast: Secondary | ICD-10-CM | POA: Diagnosis not present

## 2021-03-05 DIAGNOSIS — Z0000 Encounter for general adult medical examination without abnormal findings: Secondary | ICD-10-CM | POA: Diagnosis not present

## 2021-03-05 NOTE — Progress Notes (Signed)
MEDICARE ANNUAL WELLNESS VISIT  03/05/2021  Telephone Visit Disclaimer This Medicare AWV was conducted by telephone due to national recommendations for restrictions regarding the COVID-19 Pandemic (e.g. social distancing).  I verified, using two identifiers, that I am speaking with Carol Sanford or their authorized healthcare agent. I discussed the limitations, risks, security, and privacy concerns of performing an evaluation and management service by telephone and the potential availability of an in-person appointment in the future. The patient expressed understanding and agreed to proceed.  Location of Patient: Home Location of Provider (nurse):  In the office.  Subjective:    Carol Sanford is a 70 y.o. female patient of Carol Nielsen, DO who had a Medicare Annual Wellness Visit today via telephone. Anam is Retired and lives with their spouse. she has 2 children. she reports that she is socially active and does interact with friends/family regularly. she is minimally physically active and enjoys gardening, reading and walking.  Patient Care Team: Carol Nielsen, DO as PCP - General (Osteopathic Medicine)  Advanced Directives 03/05/2021 04/15/2017  Does Patient Have a Medical Advance Directive? Yes No  Type of Advance Directive Living will;Healthcare Power of Attorney -  Does patient want to make changes to medical advance directive? No - Patient declined -  Copy of Healthcare Power of Attorney in Chart? No - copy requested -  Would patient like information on creating a medical advance directive? - Yes (MAU/Ambulatory/Procedural Areas - Information given)    Hospital Utilization Over the Past 12 Months: # of hospitalizations or ER visits: 0 # of surgeries: 0  Review of Systems    Patient reports that her overall health is unchanged compared to last year.  History obtained from chart review and the patient  Patient Reported Readings (BP, Pulse, CBG, Weight,  etc) none  Pain Assessment Pain : No/denies pain     Current Medications & Allergies (verified) Allergies as of 03/05/2021      Reactions   Sulfa Antibiotics Itching   Lisinopril Cough      Medication List       Accurate as of Mar 05, 2021  1:34 PM. If you have any questions, ask your nurse or doctor.        B-complex with vitamin C tablet   CLARITIN PO Take by mouth.   Glucosamine-Chondroitin 750-600 MG Tabs Take 1 capsule by mouth daily.   losartan 50 MG tablet Commonly known as: COZAAR TAKE ONE TABLET BY MOUTH DAILY   Move Free Joint Health Advance Tabs   MUCINEX ALLERGY PO Take by mouth. As needed.   VITAMIN K2-VITAMIN D3 PO   ZyrTEC Allergy 10 MG tablet Generic drug: cetirizine       History (reviewed): Past Medical History:  Diagnosis Date  . Hypertension    Past Surgical History:  Procedure Laterality Date  . APPENDECTOMY  1965  . BREAST BIOPSY Left    benign 9 to 10 years ago   . BREAST SURGERY Left 2007   Biopsy left breast benign   . EYE SURGERY Left 04/03/2017   Cataract  . HYSTEROSCOPY  2000  . KNEE SURGERY Left 1997   Family History  Problem Relation Age of Onset  . Heart attack Mother   . Stroke Mother   . Alcohol abuse Father   . Hypertension Father    Social History   Socioeconomic History  . Marital status: Married    Spouse name: [name]  . Number of children: 2  . Years of education:  16  . Highest education level: Bachelor's degree (e.g., BA, AB, BS)  Occupational History    Comment: Retired  Tobacco Use  . Smoking status: Never Smoker  . Smokeless tobacco: Never Used  Vaping Use  . Vaping Use: Never used  Substance and Sexual Activity  . Alcohol use: Yes    Alcohol/week: 1.0 standard drink    Types: 1 Glasses of wine per week  . Drug use: No  . Sexual activity: Not Currently  Other Topics Concern  . Not on file  Social History Narrative   Lives with her husband. She enjoys reading, walking and gardening.    Social Determinants of Health   Financial Resource Strain: Low Risk   . Difficulty of Paying Living Expenses: Not hard at all  Food Insecurity: No Food Insecurity  . Worried About Programme researcher, broadcasting/film/video in the Last Year: Never true  . Ran Out of Food in the Last Year: Never true  Transportation Needs: No Transportation Needs  . Lack of Transportation (Medical): No  . Lack of Transportation (Non-Medical): No  Physical Activity: Inactive  . Days of Exercise per Week: 0 days  . Minutes of Exercise per Session: 0 min  Stress: No Stress Concern Present  . Feeling of Stress : Not at all  Social Connections: Moderately Integrated  . Frequency of Communication with Friends and Family: More than three times a week  . Frequency of Social Gatherings with Friends and Family: Once a week  . Attends Religious Services: More than 4 times per year  . Active Member of Clubs or Organizations: No  . Attends Banker Meetings: Never  . Marital Status: Married    Activities of Daily Living In your present state of health, do you have any difficulty performing the following activities: 03/05/2021  Hearing? N  Vision? N  Difficulty concentrating or making decisions? N  Walking or climbing stairs? N  Dressing or bathing? N  Doing errands, shopping? N  Preparing Food and eating ? N  Using the Toilet? N  In the past six months, have you accidently leaked urine? N  Do you have problems with loss of bowel control? N  Managing your Medications? N  Managing your Finances? N  Housekeeping or managing your Housekeeping? N  Some recent data might be hidden    Patient Education/ Literacy How often do you need to have someone help you when you read instructions, pamphlets, or other written materials from your doctor or pharmacy?: 1 - Never What is the last grade level you completed in school?: Bachelor's degree  Exercise Current Exercise Habits: Home exercise routine, Type of exercise:  walking, Time (Minutes): 60, Frequency (Times/Week): 4, Weekly Exercise (Minutes/Week): 240, Intensity: Mild, Exercise limited by: None identified  Diet Patient reports consuming 2 meals a day and 0-1 snack(s) a day Patient reports that her primary diet is: Regular Patient reports that she does have regular access to food.   Depression Screen PHQ 2/9 Scores 03/05/2021 06/29/2020 06/29/2019 03/31/2018 06/19/2017 04/15/2017  PHQ - 2 Score 0 0 0 0 0 0  PHQ- 9 Score - - - 0 0 -     Fall Risk Fall Risk  03/05/2021 06/29/2020 06/19/2017  Falls in the past year? 0 0 No  Number falls in past yr: 0 0 -  Injury with Fall? 0 0 -  Risk for fall due to : No Fall Risks - -  Follow up Falls evaluation completed - -  Objective:  Carol CoderGeorgene Rester seemed alert and oriented and she participated appropriately during our telephone visit.  Blood Pressure Weight BMI  BP Readings from Last 3 Encounters:  06/29/20 132/84  06/29/19 (!) 146/79  03/24/19 (!) 144/77   Wt Readings from Last 3 Encounters:  06/29/20 165 lb (74.8 kg)  06/29/19 167 lb (75.8 kg)  03/24/19 167 lb (75.8 kg)   BMI Readings from Last 1 Encounters:  06/29/20 28.32 kg/m    *Unable to obtain current vital signs, weight, and BMI due to telephone visit type  Hearing/Vision  . Daryle did not seem to have difficulty with hearing/understanding during the telephone conversation . Reports that she has had a formal eye exam by an eye care professional within the past year . Reports that she has not had a formal hearing evaluation within the past year *Unable to fully assess hearing and vision during telephone visit type  Cognitive Function: 6CIT Screen 03/05/2021  What Year? 0 points  What month? 0 points  What time? 0 points  Count back from 20 0 points  Months in reverse 0 points  Repeat phrase 0 points  Total Score 0   (Normal:0-7, Significant for Dysfunction: >8)  Normal Cognitive Function Screening: Yes   Immunization & Health  Maintenance Record Immunization History  Administered Date(s) Administered  . Fluad Quad(high Dose 65+) 06/29/2020  . Influenza, High Dose Seasonal PF 06/19/2017, 06/16/2019  . Influenza, Quadrivalent, Recombinant, Inj, Pf 07/09/2018  . PFIZER(Purple Top)SARS-COV-2 Vaccination 11/10/2018, 12/09/2019, 07/17/2020  . Pneumococcal Conjugate-13 12/15/2015  . Pneumococcal Polysaccharide-23 06/19/2017  . Tdap 06/19/2017  . Zoster Recombinat (Shingrix) 06/02/2018, 09/22/2018    Health Maintenance  Topic Date Due  . Hepatitis C Screening  03/05/2022 (Originally 02/28/1969)  . INFLUENZA VACCINE  05/14/2021  . MAMMOGRAM  08/23/2021  . DEXA SCAN  08/23/2022  . COLONOSCOPY (Pts 45-6746yrs Insurance coverage will need to be confirmed)  10/15/2023  . TETANUS/TDAP  06/20/2027  . COVID-19 Vaccine  Completed  . PNA vac Low Risk Adult  Completed  . HPV VACCINES  Aged Out       Assessment  This is a routine wellness examination for Hovnanian Enterpriseseorgene Axtman.  Health Maintenance: Due or Overdue There are no preventive care reminders to display for this patient.  Carol CoderGeorgene Maeder does not need a referral for [employer] Assistance: Care Management:   no Social Work:    no Prescription Assistance:  no Nutrition/Diabetes Education:  no   Plan:  Personalized Goals Goals Addressed              This Visit's Progress   .  Patient Stated (pt-stated)        03/05/2021 AWV Goal: Exercise for General Health   Patient will verbalize understanding of the benefits of increased physical activity:  Exercising regularly is important. It will improve your overall fitness, flexibility, and endurance.  Regular exercise also will improve your overall health. It can help you control your weight, reduce stress, and improve your bone density.  Over the next year, patient will increase physical activity as tolerated with a goal of at least 150 minutes of moderate physical activity per week.   You can tell that you are  exercising at a moderate intensity if your heart starts beating faster and you start breathing faster but can still hold a conversation.  Moderate-intensity exercise ideas include:  Walking 1 mile (1.6 km) in about 15 minutes  Biking  Hiking  Golfing  Dancing  Water aerobics  Patient will  verbalize understanding of everyday activities that increase physical activity by providing examples like the following: ? Yard work, such as: ? Pushing a Surveyor, mining ? Raking and bagging leaves ? Washing your car ? Pushing a stroller ? Shoveling snow ? Gardening ? Washing windows or floors  Patient will be able to explain general safety guidelines for exercising:   Before you start a new exercise program, talk with your health care provider.  Do not exercise so much that you hurt yourself, feel dizzy, or get very short of breath.  Wear comfortable clothes and wear shoes with good support.  Drink plenty of water while you exercise to prevent dehydration or heat stroke.  Work out until your breathing and your heartbeat get faster.       Personalized Health Maintenance & Screening Recommendations  Screening mammography - In November, 2022  Lung Cancer Screening Recommended: no (Low Dose CT Chest recommended if Age 35-80 years, 30 pack-year currently smoking OR have quit w/in past 15 years) Hepatitis C Screening recommended: yes HIV Screening recommended: no  Advanced Directives: Written information was not prepared per patient's request.  Referrals & Orders No orders of the defined types were placed in this encounter.   Follow-up Plan . Follow-up with Carol Nielsen, DO as planned, to discuss Dexa scan results and medications.  . Mammogram is due in November and your referral has been sent and they will call you to schedule.  . Medicare wellness visit in one year.   I have personally reviewed and noted the following in the patient's chart:   . Medical and social  history . Use of alcohol, tobacco or illicit drugs  . Current medications and supplements . Functional ability and status . Nutritional status . Physical activity . Advanced directives . List of other physicians . Hospitalizations, surgeries, and ER visits in previous 12 months . Vitals . Screenings to include cognitive, depression, and falls . Referrals and appointments  In addition, I have reviewed and discussed with Carol Sanford certain preventive protocols, quality metrics, and best practice recommendations. A written personalized care plan for preventive services as well as general preventive health recommendations is available and can be mailed to the patient at her request.      Modesto Charon, RN  03/05/2021

## 2021-03-05 NOTE — Patient Instructions (Addendum)
MEDICARE ANNUAL WELLNESS VISIT Health Maintenance Summary and Written Plan of Care  Carol Sanford ,  Thank you for allowing me to perform your Medicare Annual Wellness Visit and for your ongoing commitment to your health.   Health Maintenance & Immunization History Health Maintenance  Topic Date Due  . Hepatitis C Screening  03/05/2022 (Originally 02/28/1969)  . INFLUENZA VACCINE  05/14/2021  . MAMMOGRAM  08/23/2021  . DEXA SCAN  08/23/2022  . COLONOSCOPY (Pts 45-35yrs Insurance coverage will need to be confirmed)  10/15/2023  . TETANUS/TDAP  06/20/2027  . COVID-19 Vaccine  Completed  . PNA vac Low Risk Adult  Completed  . HPV VACCINES  Aged Out   Immunization History  Administered Date(s) Administered  . Fluad Quad(high Dose 65+) 06/29/2020  . Influenza, High Dose Seasonal PF 06/19/2017, 06/16/2019  . Influenza, Quadrivalent, Recombinant, Inj, Pf 07/09/2018  . PFIZER(Purple Top)SARS-COV-2 Vaccination 11/10/2018, 12/09/2019, 07/17/2020  . Pneumococcal Conjugate-13 12/15/2015  . Pneumococcal Polysaccharide-23 06/19/2017  . Tdap 06/19/2017  . Zoster Recombinat (Shingrix) 06/02/2018, 09/22/2018    These are the patient goals that we discussed: Goals Addressed              This Visit's Progress   .  Patient Stated (pt-stated)        03/05/2021 AWV Goal: Exercise for General Health   Patient will verbalize understanding of the benefits of increased physical activity:  Exercising regularly is important. It will improve your overall fitness, flexibility, and endurance.  Regular exercise also will improve your overall health. It can help you control your weight, reduce stress, and improve your bone density.  Over the next year, patient will increase physical activity as tolerated with a goal of at least 150 minutes of moderate physical activity per week.   You can tell that you are exercising at a moderate intensity if your heart starts beating faster and you start breathing  faster but can still hold a conversation.  Moderate-intensity exercise ideas include:  Walking 1 mile (1.6 km) in about 15 minutes  Biking  Hiking  Golfing  Dancing  Water aerobics  Patient will verbalize understanding of everyday activities that increase physical activity by providing examples like the following: ? Yard work, such as: ? Pushing a Surveyor, mining ? Raking and bagging leaves ? Washing your car ? Pushing a stroller ? Shoveling snow ? Gardening ? Washing windows or floors  Patient will be able to explain general safety guidelines for exercising:   Before you start a new exercise program, talk with your health care provider.  Do not exercise so much that you hurt yourself, feel dizzy, or get very short of breath.  Wear comfortable clothes and wear shoes with good support.  Drink plenty of water while you exercise to prevent dehydration or heat stroke.  Work out until your breathing and your heartbeat get faster.         This is a list of Health Maintenance Items that are overdue or due now: Screening mammography - In November, 2022  Orders/Referrals Placed Today: No orders of the defined types were placed in this encounter.  (Contact our referral department at [phone] if you have not spoken with someone about your referral appointment within the next 5 days)    Follow-up Plan . Follow-up with [name], DO as planned, to discuss Dexa scan results and medications.  . Mammogram is due in November and your referral has been sent and they will call you to schedule.  Marland Kitchen  Medicare wellness visit in one year.       Health Maintenance, Female Adopting a healthy lifestyle and getting preventive care are important in promoting health and wellness. Ask your health care provider about:  The right schedule for you to have regular tests and exams.  Things you can do on your own to prevent diseases and keep yourself healthy. What should I know  about diet, weight, and exercise? Eat a healthy diet  Eat a diet that includes plenty of vegetables, fruits, low-fat dairy products, and lean protein.  Do not eat a lot of foods that are high in solid fats, added sugars, or sodium.   Maintain a healthy weight Body mass index (BMI) is used to identify weight problems. It estimates body fat based on height and weight. Your health care provider can help determine your BMI and help you achieve or maintain a healthy weight. Get regular exercise Get regular exercise. This is one of the most important things you can do for your health. Most adults should:  Exercise for at least 150 minutes each week. The exercise should increase your heart rate and make you sweat (moderate-intensity exercise).  Do strengthening exercises at least twice a week. This is in addition to the moderate-intensity exercise.  Spend less time sitting. Even light physical activity can be beneficial. Watch cholesterol and blood lipids Have your blood tested for lipids and cholesterol at 69 years of age, then have this test every 5 years. Have your cholesterol levels checked more often if:  Your lipid or cholesterol levels are high.  You are older than 69 years of age.  You are at high risk for heart disease. What should I know about cancer screening? Depending on your health history and family history, you may need to have cancer screening at various ages. This may include screening for:  Breast cancer.  Cervical cancer.  Colorectal cancer.  Skin cancer.  Lung cancer. What should I know about heart disease, diabetes, and high blood pressure? Blood pressure and heart disease  High blood pressure causes heart disease and increases the risk of stroke. This is more likely to develop in people who have high blood pressure readings, are of African descent, or are overweight.  Have your blood pressure checked: ? Every 3-5 years if you are 70-80 years of age. ? Every  year if you are 8 years old or older. Diabetes Have regular diabetes screenings. This checks your fasting blood sugar level. Have the screening done:  Once every three years after age 17 if you are at a normal weight and have a low risk for diabetes.  More often and at a younger age if you are overweight or have a high risk for diabetes. What should I know about preventing infection? Hepatitis B If you have a higher risk for hepatitis B, you should be screened for this virus. Talk with your health care provider to find out if you are at risk for hepatitis B infection. Hepatitis C Testing is recommended for:  Everyone born from [birth year] through 1964.  Anyone with known risk factors for hepatitis C. Sexually transmitted infections (STIs)  Get screened for STIs, including gonorrhea and chlamydia, if: ? You are sexually active and are younger than 69 years of age. ? You are older than 69 years of age and your health care provider tells you that you are at risk for this type of infection. ? Your sexual activity has changed since you were last screened, and you  are at increased risk for chlamydia or gonorrhea. Ask your health care provider if you are at risk.  Ask your health care provider about whether you are at high risk for HIV. Your health care provider may recommend a prescription medicine to help prevent HIV infection. If you choose to take medicine to prevent HIV, you should first get tested for HIV. You should then be tested every 3 months for as long as you are taking the medicine. Pregnancy  If you are about to stop having your period (premenopausal) and you may become pregnant, seek counseling before you get pregnant.  Take 400 to 800 micrograms (mcg) of folic acid every day if you become pregnant.  Ask for birth control (contraception) if you want to prevent pregnancy. Osteoporosis and menopause Osteoporosis is a disease in which the bones lose minerals and strength with aging.  This can result in bone fractures. If you are 28 years old or older, or if you are at risk for osteoporosis and fractures, ask your health care provider if you should:  Be screened for bone loss.  Take a calcium or vitamin D supplement to lower your risk of fractures.  Be given hormone replacement therapy (HRT) to treat symptoms of menopause. Follow these instructions at home: Lifestyle  Do not use any products that contain nicotine or tobacco, such as cigarettes, e-cigarettes, and chewing tobacco. If you need help quitting, ask your health care provider.  Do not use street drugs.  Do not share needles.  Ask your health care provider for help if you need support or information about quitting drugs. Alcohol use  Do not drink alcohol if: ? Your health care provider tells you not to drink. ? You are pregnant, may be pregnant, or are planning to become pregnant.  If you drink alcohol: ? Limit how much you use to 0-1 drink a day. ? Limit intake if you are breastfeeding.  Be aware of how much alcohol is in your drink. In the [place] one drink equals one 12 oz bottle of beer (355 mL), one 5 oz glass of wine (148 mL), or one 1 oz glass of hard liquor (44 mL). General instructions  Schedule regular health, dental, and eye exams.  Stay current with your vaccines.  Tell your health care provider if: ? You often feel depressed. ? You have ever been abused or do not feel safe at home. Summary  Adopting a healthy lifestyle and getting preventive care are important in promoting health and wellness.  Follow your health care provider's instructions about healthy diet, exercising, and getting tested or screened for diseases.  Follow your health care provider's instructions on monitoring your cholesterol and blood pressure. This information is not intended to replace advice given to you by your health care provider. Make sure you discuss any questions you have with your health care  provider. Document Revised: 09/23/2018 Document Reviewed: 09/23/2018 Elsevier Patient Education  2021 [employer].

## 2021-06-26 ENCOUNTER — Other Ambulatory Visit: Payer: Self-pay | Admitting: Osteopathic Medicine

## 2021-06-26 DIAGNOSIS — I10 Essential (primary) hypertension: Secondary | ICD-10-CM

## 2021-07-03 ENCOUNTER — Ambulatory Visit (INDEPENDENT_AMBULATORY_CARE_PROVIDER_SITE_OTHER): Payer: Medicare PPO | Admitting: Osteopathic Medicine

## 2021-07-03 ENCOUNTER — Other Ambulatory Visit: Payer: Self-pay

## 2021-07-03 ENCOUNTER — Encounter: Payer: Self-pay | Admitting: Osteopathic Medicine

## 2021-07-03 VITALS — BP 125/79 | HR 83 | Temp 98.3°F | Ht 62.5 in | Wt 169.0 lb

## 2021-07-03 DIAGNOSIS — Z23 Encounter for immunization: Secondary | ICD-10-CM

## 2021-07-03 DIAGNOSIS — Z0000 Encounter for general adult medical examination without abnormal findings: Secondary | ICD-10-CM | POA: Diagnosis not present

## 2021-07-03 DIAGNOSIS — Z8739 Personal history of other diseases of the musculoskeletal system and connective tissue: Secondary | ICD-10-CM

## 2021-07-03 DIAGNOSIS — I10 Essential (primary) hypertension: Secondary | ICD-10-CM | POA: Diagnosis not present

## 2021-07-03 DIAGNOSIS — E559 Vitamin D deficiency, unspecified: Secondary | ICD-10-CM | POA: Diagnosis not present

## 2021-07-03 MED ORDER — LOSARTAN POTASSIUM 50 MG PO TABS: 50.0000 mg | ORAL_TABLET | Freq: Every day | ORAL | 3 refills | Status: DC

## 2021-07-03 NOTE — Patient Instructions (Addendum)
General Preventive Care Most recent routine screening labs: ordered.  Blood pressure goal 130/80 or less.  Tobacco: don't!  Alcohol: responsible moderation is ok for most adults - if you have concerns about your alcohol intake, please talk to me!  Exercise: as tolerated to reduce risk of cardiovascular disease and diabetes. Strength training will also prevent osteoporosis.  Mental health: if need for mental health care (medicines, counseling, other), or concerns about moods, please let me know!  Sexual / Reproductive health: if need for STD testing, or if concerns with libido/pain problems, please let me know!  Advanced Directive: Living Will and/or Healthcare Power of Attorney recommended for all adults, regardless of age or health.  Vaccines Flu vaccine: for almost everyone, every fall.  Shingles vaccine: all done!  Pneumonia vaccines: all done! Tetanus booster: every 10 years, due 2028 COVID vaccine: THANKS for getting your vaccine! :) FALL BOOSTER STRONGLY RECOMMENDED! Cancer screenings  Colon cancer screening: for everyone age 37-75. Previous screening not on file but reported done 2015! Please follow-up per GI recommendations.  Breast cancer screening: mammogram annually after age 22. Call [phone]  Cervical cancer screening: if normal, Paps stop at age 31 or w/ hysterectomy.  Lung cancer screening: not needed for non-smokers  Infection screenings  HIV: recommended screening at least once age 75-65, more often as needed. Gonorrhea/Chlamydia: screening as needed Hepatitis C: recommended once for everyone age 41-75 TB: certain at-risk populations Other Bone Density Test: recommended repeat 08/2022

## 2021-07-03 NOTE — Progress Notes (Signed)
Carol Sanford is a 70 y.o. female who presents to  [facility]  today, 07/03/21, seeking care for the following:  Annual physical  Meds refilled No other concerns today! Would like to defer osteoporosis medications, hx side effects / issues getting prolia. She is active, low fall risk, we discussed risks/benefits of treatment vs no treatment  Had advanced directive, will bring [place] copy     ASSESSMENT & PLAN with other pertinent findings:  The primary encounter diagnosis was Annual physical exam. Diagnoses of Need for influenza vaccination, History of osteopenia, Primary hypertension, and Essential hypertension were also pertinent to this visit.    Patient Instructions  General Preventive Care Most recent routine screening labs: ordered.  Blood pressure goal 130/80 or less.  Tobacco: don't!  Alcohol: responsible moderation is ok for most adults - if you have concerns about your alcohol intake, please talk to me!  Exercise: as tolerated to reduce risk of cardiovascular disease and diabetes. Strength training will also prevent osteoporosis.  Mental health: if need for mental health care (medicines, counseling, other), or concerns about moods, please let me know!  Sexual / Reproductive health: if need for STD testing, or if concerns with libido/pain problems, please let me know!  Advanced Directive: Living Will and/or Healthcare Power of Attorney recommended for all adults, regardless of age or health.  Vaccines Flu vaccine: for almost everyone, every fall.  Shingles vaccine: all done!  Pneumonia vaccines: all done! Tetanus booster: every 10 years, due 2028 COVID vaccine: THANKS for getting your vaccine! :) FALL BOOSTER STRONGLY RECOMMENDED! Cancer screenings  Colon cancer screening: for everyone age 63-75. Previous screening not on file but reported done 2015! Please follow-up per GI recommendations.  Breast cancer screening:  mammogram annually after age 81. Call [phone]  Cervical cancer screening: if normal, Paps stop at age 20 or w/ hysterectomy.  Lung cancer screening: not needed for non-smokers  Infection screenings  HIV: recommended screening at least once age 66-65, more often as needed. Gonorrhea/Chlamydia: screening as needed Hepatitis C: recommended once for everyone age 40-75 TB: certain at-risk populations Other Bone Density Test: recommended repeat 08/2022  Orders Placed This Encounter  Procedures   Flu Vaccine QUAD High Dose(Fluad)   COMPLETE METABOLIC PANEL WITH GFR   Lipid panel   VITAMIN D 25 Hydroxy (Vit-D Deficiency, Fractures)   CBC With Differential   CBC with Differential/Platelet    Meds ordered this encounter  Medications   losartan (COZAAR) 50 MG tablet    Sig: Take 1 tablet (50 mg total) by mouth daily.    Dispense:  90 tablet    Refill:  3     See below for relevant physical exam findings  See below for recent lab and imaging results reviewed  Medications, allergies, PMH, PSH, SocH, FamH reviewed below    Follow-up instructions: Return in about 1 year (around 07/03/2022) for ROUTINE ANNUAL PHYSICAL (OR SOONER IF NEEDED), MYCHART SET TO ALERT YOU TO CALL TO SCHEDULE.                                        Exam:  BP 125/79   Pulse 83   Temp 98.3 F (36.8 C)   Ht 5' 2.5" (1.588 m)   Wt 169 lb (76.7 kg)   SpO2 96%   BMI 30.42 kg/m  Constitutional: VS see above.  General Appearance: alert, well-developed, well-nourished, NAD Neck: No masses, trachea midline.  Respiratory: Normal respiratory effort. no wheeze, no rhonchi, no rales Cardiovascular: S1/S2 normal, no murmur, no rub/gallop auscultated. RRR.  Musculoskeletal: Gait normal. Symmetric and independent movement of all extremities Abdominal: non-tender, non-distended, no appreciable organomegaly, neg Murphy's, BS WNLx4 Neurological: Normal balance/coordination. No  tremor. Skin: warm, dry, intact.  Psychiatric: Normal judgment/insight. Normal mood and affect. Oriented x3.   Current Meds  Medication Sig   B Complex-C (B-COMPLEX WITH VITAMIN C) tablet    cetirizine (ZYRTEC) 10 MG tablet Take 10 mg by mouth daily as needed.   Fexofenadine HCl (MUCINEX ALLERGY PO) Take by mouth. As needed.   Glucos-Chond-Hyal Ac-Ca Fructo (MOVE FREE JOINT HEALTH ADVANCE) TABS    Glucosamine-Chondroitin 750-600 MG TABS Take 1 capsule by mouth daily.   Vitamin D-Vitamin K (VITAMIN K2-VITAMIN D3 PO)    [DISCONTINUED] losartan (COZAAR) 50 MG tablet Take 1 tablet (50 mg total) by mouth daily. Needs appointment.    Allergies  Allergen Reactions   Sulfa Antibiotics Itching   Lisinopril Cough    Patient Active Problem List   Diagnosis Date Noted   Skin lesion of neck 08/11/2018   Plantar fasciitis, left 06/19/2017   History of osteopenia 06/19/2017   Hypertension    PVD (posterior vitreous detachment), left eye 12/06/2013   Regular astigmatism 12/06/2013   Senile incipient cataract 12/06/2013    Family History  Problem Relation Age of Onset   Heart attack Mother    Stroke Mother    Alcohol abuse Father    Hypertension Father     Social History   Tobacco Use  Smoking Status Never  Smokeless Tobacco Never    Past Surgical History:  Procedure Laterality Date   APPENDECTOMY  1965   BREAST BIOPSY Left    benign 9 to 10 years ago    BREAST SURGERY Left 2007   Biopsy left breast benign    EYE SURGERY Left 04/03/2017   Cataract   HYSTEROSCOPY  2000   KNEE SURGERY Left 1997    Immunization History  Administered Date(s) Administered   Fluad Quad(high Dose 65+) 06/29/2020, 07/03/2021   Influenza, High Dose Seasonal PF 06/19/2017, 06/16/2019   Influenza, Quadrivalent, Recombinant, Inj, Pf 07/09/2018   PFIZER(Purple Top)SARS-COV-2 Vaccination 11/10/2018, 12/09/2019, 07/17/2020, 04/13/2021   Pneumococcal Conjugate-13 12/15/2015   Pneumococcal  Polysaccharide-23 06/19/2017   Tdap 06/19/2017   Zoster Recombinat (Shingrix) 06/02/2018, 09/22/2018    No results found for this or any previous visit (from the past 2160 hour(s)).  No results found.     All questions at time of visit were answered - patient instructed to contact office with any additional concerns or updates. ER/RTC precautions were reviewed with the patient as applicable.   Please note: manual typing as well as voice recognition software may have been used to produce this document - typos may escape review. Please contact Dr. Lyn Hollingshead for any needed clarifications.

## 2021-07-04 LAB — CBC WITH DIFFERENTIAL/PLATELET
Absolute Monocytes: 310 cells/uL (ref 200–950)
Basophils Absolute: 52 cells/uL (ref 0–200)
Basophils Relative: 1.2 %
Eosinophils Absolute: 99 cells/uL (ref 15–500)
Eosinophils Relative: 2.3 %
HCT: 39.4 % (ref 35.0–45.0)
Hemoglobin: 12.8 g/dL (ref 11.7–15.5)
Lymphs Abs: 860 cells/uL (ref 850–3900)
MCH: 30.5 pg (ref 27.0–33.0)
MCHC: 32.5 g/dL (ref 32.0–36.0)
MCV: 93.8 fL (ref 80.0–100.0)
MPV: 11.3 fL (ref 7.5–12.5)
Monocytes Relative: 7.2 %
Neutro Abs: 2980 cells/uL (ref 1500–7800)
Neutrophils Relative %: 69.3 %
Platelets: 230 10*3/uL (ref 140–400)
RBC: 4.2 10*6/uL (ref 3.80–5.10)
RDW: 12.5 % (ref 11.0–15.0)
Total Lymphocyte: 20 %
WBC: 4.3 10*3/uL (ref 3.8–10.8)

## 2021-07-04 LAB — LIPID PANEL
Cholesterol: 214 mg/dL — ABNORMAL HIGH (ref <200)
HDL: 68 mg/dL (ref >=50)
LDL Cholesterol (Calc): 121 mg/dL (calc) — ABNORMAL HIGH
Non-HDL Cholesterol (Calc): 146 mg/dL (calc) — ABNORMAL HIGH (ref <130)
Total CHOL/HDL Ratio: 3.1 (calc) (ref <5.0)
Triglycerides: 131 mg/dL (ref <150)

## 2021-07-04 LAB — COMPLETE METABOLIC PANEL WITH GFR
AG Ratio: 2.4 (calc) (ref 1.0–2.5)
ALT: 18 U/L (ref 6–29)
AST: 16 U/L (ref 10–35)
Albumin: 4.8 g/dL (ref 3.6–5.1)
Alkaline phosphatase (APISO): 49 U/L (ref 37–153)
BUN: 15 mg/dL (ref 7–25)
CO2: 29 mmol/L (ref 20–32)
Calcium: 9.9 mg/dL (ref 8.6–10.4)
Chloride: 104 mmol/L (ref 98–110)
Creat: 0.78 mg/dL (ref 0.50–1.05)
Globulin: 2 g/dL (calc) (ref 1.9–3.7)
Glucose, Bld: 94 mg/dL (ref 65–99)
Potassium: 4.8 mmol/L (ref 3.5–5.3)
Sodium: 140 mmol/L (ref 135–146)
Total Bilirubin: 0.7 mg/dL (ref 0.2–1.2)
Total Protein: 6.8 g/dL (ref 6.1–8.1)
eGFR: 82 mL/min/{1.73_m2} (ref >=60)

## 2021-07-04 LAB — VITAMIN D 25 HYDROXY (VIT D DEFICIENCY, FRACTURES): Vit D, 25-Hydroxy: 49 ng/mL (ref 30–100)

## 2021-07-30 DIAGNOSIS — H02834 Dermatochalasis of left upper eyelid: Secondary | ICD-10-CM | POA: Diagnosis not present

## 2021-07-30 DIAGNOSIS — Z961 Presence of intraocular lens: Secondary | ICD-10-CM | POA: Diagnosis not present

## 2021-07-30 DIAGNOSIS — H353131 Nonexudative age-related macular degeneration, bilateral, early dry stage: Secondary | ICD-10-CM | POA: Diagnosis not present

## 2021-07-30 DIAGNOSIS — H02831 Dermatochalasis of right upper eyelid: Secondary | ICD-10-CM | POA: Diagnosis not present

## 2021-07-30 DIAGNOSIS — H43813 Vitreous degeneration, bilateral: Secondary | ICD-10-CM | POA: Diagnosis not present

## 2021-07-30 DIAGNOSIS — H527 Unspecified disorder of refraction: Secondary | ICD-10-CM | POA: Diagnosis not present

## 2021-07-30 DIAGNOSIS — H26493 Other secondary cataract, bilateral: Secondary | ICD-10-CM | POA: Diagnosis not present

## 2021-07-30 LAB — HM DIABETES EYE EXAM

## 2021-08-29 ENCOUNTER — Ambulatory Visit: Payer: Medicare PPO

## 2021-09-05 ENCOUNTER — Ambulatory Visit (INDEPENDENT_AMBULATORY_CARE_PROVIDER_SITE_OTHER): Payer: Medicare PPO

## 2021-09-05 ENCOUNTER — Other Ambulatory Visit: Payer: Self-pay

## 2021-09-05 DIAGNOSIS — Z1231 Encounter for screening mammogram for malignant neoplasm of breast: Secondary | ICD-10-CM

## 2021-09-05 DIAGNOSIS — Z0000 Encounter for general adult medical examination without abnormal findings: Secondary | ICD-10-CM

## 2021-09-11 NOTE — Progress Notes (Signed)
Please call patient. Normal mammogram.  Repeat in 1 year.  

## 2021-12-24 ENCOUNTER — Other Ambulatory Visit: Payer: Self-pay

## 2021-12-24 ENCOUNTER — Telehealth (INDEPENDENT_AMBULATORY_CARE_PROVIDER_SITE_OTHER): Payer: Medicare PPO | Admitting: Sports Medicine

## 2021-12-24 DIAGNOSIS — J0100 Acute maxillary sinusitis, unspecified: Secondary | ICD-10-CM | POA: Diagnosis not present

## 2021-12-24 DIAGNOSIS — L304 Erythema intertrigo: Secondary | ICD-10-CM | POA: Diagnosis not present

## 2021-12-24 MED ORDER — PREDNISONE 50 MG PO TABS: 50.0000 mg | ORAL_TABLET | Freq: Every day | ORAL | 0 refills | Status: DC

## 2021-12-24 MED ORDER — CLOTRIMAZOLE-BETAMETHASONE 1-0.05 % EX CREA: 1.0000 "application " | TOPICAL_CREAM | Freq: Two times a day (BID) | CUTANEOUS | 0 refills | Status: DC

## 2021-12-24 MED ORDER — AZITHROMYCIN 250 MG PO TABS: ORAL_TABLET | ORAL | 0 refills | Status: DC

## 2021-12-24 NOTE — Progress Notes (Addendum)
   Virtual Visit via WebEx/MyChart   I connected with  Jeannetta Orris  on 12/26/21 via WebEx/MyChart/Doximity Video and verified that I am speaking with the correct person using two identifiers.   I discussed the limitations, risks, security and privacy concerns of performing an evaluation and management service by WebEx/MyChart/Doximity Video, including the higher likelihood of inaccurate diagnosis and treatment, and the availability of in person appointments.  We also discussed the likely need of an additional face to face encounter for complete and high quality delivery of care.  I also discussed with the patient that there may be a patient responsible charge related to this service. The patient expressed understanding and wishes to proceed.  Provider location is in medical facility. Patient location is at their home, different from provider location. People involved in care of the patient during this telehealth encounter were myself, my nurse/medical assistant, and my front office/scheduling team member.  Review of Systems: No fevers, chills, night sweats, weight loss, chest pain, or shortness of breath.   Objective Findings:    General: Speaking full sentences, no audible heavy breathing.  Sounds alert and appropriately interactive.  Appears well.  Face symmetric.  Extraocular movements intact.  Pupils equal and round.  No nasal flaring or accessory muscle use visualized.  Independent interpretation of tests performed by another provider:   None.  Brief History, Exam, Impression, and Recommendations:    Intertrigo inframammary folds Itchy rash at the inframammary folds bilaterally. She has tried Neosporin, hydrocortisone, triamcinolone , all without improvement. I explained to her that intertrigo in the inframammary folds was likely fungal in nature and that we need to add an antifungal to the regimen, switching to Lotrisone . I also advised her to do everything possible to keep the area  dry with either antiperspirant or powder in the meantime. If insufficient improvement after a few weeks we will need to see her in person.  Acute maxillary sinusitis History of COVID-19 a couple weeks ago, unfortunately starting to have sinus pressure around the face, thick whitish nasal discharge. Feels like previous sinus infections she has had. This is likely bacterial superinfection post COVID, adding azithromycin , prednisone . Return as needed.  I discussed the above assessment and treatment plan with the patient. The patient was provided an opportunity to ask questions and all were answered. The patient agreed with the plan and demonstrated an understanding of the instructions.   The patient was advised to call back or seek an in-person evaluation if the symptoms worsen or if the condition fails to improve as anticipated.   I provided 30 minutes of face to face and non-face-to-face time during this encounter date, time was needed to gather information, review chart, records, communicate/coordinate with staff remotely, as well as complete documentation.   ___________________________________________ Debby PARAS. Curtis, M.D., ABFM., CAQSM. Primary Care and Sports Medicine [facility]  Adjunct Instructor of Family Medicine  [employer]

## 2021-12-24 NOTE — Assessment & Plan Note (Signed)
Itchy rash at the inframammary folds bilaterally. ?She has tried Neosporin, hydrocortisone, triamcinolone, all without improvement. ?I explained to her that intertrigo in the inframammary folds was likely fungal in nature and that we need to add an antifungal to the regimen, switching to Lotrisone. ?I also advised her to do everything possible to keep the area dry with either antiperspirant or powder in the meantime. ?If insufficient improvement after a few weeks we will need to see her in person. ?

## 2021-12-24 NOTE — Assessment & Plan Note (Signed)
History of COVID-19 a couple weeks ago, unfortunately starting to have sinus pressure around the face, thick whitish nasal discharge. ?Feels like previous sinus infections she has had. ?This is likely bacterial superinfection post COVID, adding azithromycin, prednisone. ?Return as needed. ?

## 2021-12-24 NOTE — Progress Notes (Signed)
End of February patient got COVID. Tested negative last week but still has a lot of sinus pressure around center of face and fatigue. Discharge is thick and white. Rash under left breast for 2 weeks that seems to be spreading. Tried neosporin and cortisone and triamcinolone. Itchy, red, and raised and painful.  ?

## 2022-04-08 ENCOUNTER — Encounter: Payer: Self-pay | Admitting: Medical-Surgical

## 2022-04-08 ENCOUNTER — Ambulatory Visit: Payer: Medicare PPO | Admitting: Medical-Surgical

## 2022-04-08 VITALS — BP 127/71 | HR 75 | Resp 20 | Ht 62.5 in | Wt 170.3 lb

## 2022-04-08 DIAGNOSIS — Z0000 Encounter for general adult medical examination without abnormal findings: Secondary | ICD-10-CM | POA: Diagnosis not present

## 2022-04-08 DIAGNOSIS — Z8739 Personal history of other diseases of the musculoskeletal system and connective tissue: Secondary | ICD-10-CM

## 2022-04-08 DIAGNOSIS — L304 Erythema intertrigo: Secondary | ICD-10-CM | POA: Diagnosis not present

## 2022-04-08 DIAGNOSIS — M810 Age-related osteoporosis without current pathological fracture: Secondary | ICD-10-CM | POA: Diagnosis not present

## 2022-04-08 DIAGNOSIS — I10 Essential (primary) hypertension: Secondary | ICD-10-CM

## 2022-04-08 MED ORDER — CLOTRIMAZOLE-BETAMETHASONE 1-0.05 % EX CREA: 1.0000 | TOPICAL_CREAM | Freq: Two times a day (BID) | CUTANEOUS | 0 refills | Status: DC

## 2022-05-23 ENCOUNTER — Encounter: Payer: Self-pay | Admitting: Medical-Surgical

## 2022-05-23 ENCOUNTER — Ambulatory Visit: Payer: Medicare PPO | Admitting: Medical-Surgical

## 2022-05-23 VITALS — BP 137/79 | HR 70 | Resp 20 | Ht 62.5 in | Wt 166.2 lb

## 2022-05-23 DIAGNOSIS — R21 Rash and other nonspecific skin eruption: Secondary | ICD-10-CM

## 2022-05-23 DIAGNOSIS — I10 Essential (primary) hypertension: Secondary | ICD-10-CM

## 2022-05-23 MED ORDER — LOSARTAN POTASSIUM 50 MG PO TABS: 50.0000 mg | ORAL_TABLET | Freq: Every day | ORAL | 3 refills | Status: DC

## 2022-05-23 MED ORDER — KETOCONAZOLE 2 % EX SHAM: MEDICATED_SHAMPOO | CUTANEOUS | 1 refills | Status: DC

## 2022-05-23 NOTE — Progress Notes (Signed)
   Established Patient Office Visit  Subjective   Patient ID: Carol Sanford, female   DOB: 1951-08-22 Age: 72 y.o. MRN: [id number]   No chief complaint on file.  HPI Pleasant 71 year old female presenting today for evaluation of a rash that developed on the back of her head in the scalp area. She noticed it 3-4 weeks ago and notes that it is itchy. Tried Head and Shoulders with some benefit but no resolution. Went to her hair dresser who advised her to see her primary care.  Notes that her scalp has always been oily and she tends to wash her hair every other day.   Objective:    Vitals:   05/23/22 0901 05/23/22 0922  BP: 139/82 137/79  Pulse: 74 70  Resp: 20   Height: 5' 2.5" (1.588 m)   Weight: 166 lb 3.2 oz (75.4 kg)   SpO2: 99%   BMI (Calculated): 29.9     Physical Exam Vitals and nursing note reviewed.  Constitutional:      General: She is not in acute distress.    Appearance: Normal appearance. She is not ill-appearing.  HENT:     Head: Normocephalic and atraumatic.  Cardiovascular:     Rate and Rhythm: Normal rate and regular rhythm.     Pulses: Normal pulses.     Heart sounds: Normal heart sounds.  Pulmonary:     Effort: Pulmonary effort is normal. No respiratory distress.     Breath sounds: Normal breath sounds. No wheezing, rhonchi or rales.  Skin:    General: Skin is warm and dry.     Findings: Rash (Small erythematous papules scattered to the back of the scalp and behind the ears.  No vesicles or pustules.  No crusting.) present.  Neurological:     Mental Status: She is alert and oriented to person, place, and time.  Psychiatric:        Mood and Affect: Mood normal.        Behavior: Behavior normal.        Thought Content: Thought content normal.        Judgment: Judgment normal.   No results found for this or any previous visit (from the past 24 hour(s)).     The 10-year ASCVD risk score (Arnett DK, et al., 2019) is: 14%   Values used to calculate the  score:     Age: 53 years     Sex: Female     Is Non-Hispanic African American: No     Diabetic: No     Tobacco smoker: No     Systolic Blood Pressure: 137 mmHg     Is BP treated: Yes     HDL Cholesterol: 68 mg/dL     Total Cholesterol: 214 mg/dL   Assessment & Plan:   1. Essential hypertension Blood pressure slightly elevated on arrival and 139/82, recheck of - losartan (COZAAR) 50 MG tablet; Take 1 tablet (50 mg total) by mouth daily.  Dispense: 90 tablet; Refill: 3  2. Rash and nonspecific skin eruption Unclear etiology.  We will start with ketoconazole 2% shampoo twice weekly for 8 weeks then reduce to weekly use as needed.  If that is unhelpful, we could consider Derma-Smoothe scalp oil.  Return if symptoms worsen or fail to improve.  Keep scheduled appointment in September for her annual physical. ___________________________________________ Thayer Ohm, DNP, APRN, FNP-BC Primary Care and Sports Medicine [facility]

## 2022-05-27 ENCOUNTER — Ambulatory Visit: Payer: Medicare PPO | Admitting: Sports Medicine

## 2022-06-05 ENCOUNTER — Encounter: Payer: Self-pay | Admitting: General Practice

## 2022-06-11 DIAGNOSIS — Z0000 Encounter for general adult medical examination without abnormal findings: Secondary | ICD-10-CM | POA: Diagnosis not present

## 2022-06-11 DIAGNOSIS — I10 Essential (primary) hypertension: Secondary | ICD-10-CM | POA: Diagnosis not present

## 2022-06-12 LAB — CBC WITH DIFFERENTIAL/PLATELET
Absolute Monocytes: 294 cells/uL (ref 200–950)
Basophils Absolute: 30 cells/uL (ref 0–200)
Basophils Relative: 1 %
Eosinophils Absolute: 102 cells/uL (ref 15–500)
Eosinophils Relative: 3.4 %
HCT: 37.7 % (ref 35.0–45.0)
Hemoglobin: 12.9 g/dL (ref 11.7–15.5)
Lymphs Abs: 708 cells/uL — ABNORMAL LOW (ref 850–3900)
MCH: 31.2 pg (ref 27.0–33.0)
MCHC: 34.2 g/dL (ref 32.0–36.0)
MCV: 91.3 fL (ref 80.0–100.0)
MPV: 11.2 fL (ref 7.5–12.5)
Monocytes Relative: 9.8 %
Neutro Abs: 1866 cells/uL (ref 1500–7800)
Neutrophils Relative %: 62.2 %
Platelets: 226 10*3/uL (ref 140–400)
RBC: 4.13 10*6/uL (ref 3.80–5.10)
RDW: 12.8 % (ref 11.0–15.0)
Total Lymphocyte: 23.6 %
WBC: 3 10*3/uL — ABNORMAL LOW (ref 3.8–10.8)

## 2022-06-12 LAB — COMPLETE METABOLIC PANEL WITH GFR
AG Ratio: 2.1 (calc) (ref 1.0–2.5)
ALT: 15 U/L (ref 6–29)
AST: 17 U/L (ref 10–35)
Albumin: 4.8 g/dL (ref 3.6–5.1)
Alkaline phosphatase (APISO): 53 U/L (ref 37–153)
BUN: 11 mg/dL (ref 7–25)
CO2: 25 mmol/L (ref 20–32)
Calcium: 9.7 mg/dL (ref 8.6–10.4)
Chloride: 104 mmol/L (ref 98–110)
Creat: 0.82 mg/dL (ref 0.60–1.00)
Globulin: 2.3 g/dL (calc) (ref 1.9–3.7)
Glucose, Bld: 96 mg/dL (ref 65–99)
Potassium: 4.4 mmol/L (ref 3.5–5.3)
Sodium: 139 mmol/L (ref 135–146)
Total Bilirubin: 0.7 mg/dL (ref 0.2–1.2)
Total Protein: 7.1 g/dL (ref 6.1–8.1)
eGFR: 77 mL/min/{1.73_m2} (ref >=60)

## 2022-06-12 LAB — LIPID PANEL
Cholesterol: 201 mg/dL — ABNORMAL HIGH (ref <200)
HDL: 75 mg/dL (ref >=50)
LDL Cholesterol (Calc): 110 mg/dL (calc) — ABNORMAL HIGH
Non-HDL Cholesterol (Calc): 126 mg/dL (calc) (ref <130)
Total CHOL/HDL Ratio: 2.7 (calc) (ref <5.0)
Triglycerides: 74 mg/dL (ref <150)

## 2022-07-05 ENCOUNTER — Encounter: Payer: Medicare PPO | Admitting: Medical-Surgical

## 2022-07-12 ENCOUNTER — Encounter: Payer: Self-pay | Admitting: Medical-Surgical

## 2022-07-12 ENCOUNTER — Ambulatory Visit: Payer: Medicare PPO | Admitting: Medical-Surgical

## 2022-07-12 VITALS — BP 151/70 | HR 80 | Resp 20 | Ht 62.5 in | Wt 169.3 lb

## 2022-07-12 DIAGNOSIS — Z2821 Immunization not carried out because of patient refusal: Secondary | ICD-10-CM | POA: Diagnosis not present

## 2022-07-12 DIAGNOSIS — R21 Rash and other nonspecific skin eruption: Secondary | ICD-10-CM

## 2022-07-12 MED ORDER — PREDNISONE 20 MG PO TABS: ORAL_TABLET | ORAL | 0 refills | Status: AC

## 2022-07-12 NOTE — Progress Notes (Signed)
   Established Patient Office Visit  Subjective   Patient ID: Carol Sanford, female    DOB: 1951/08/29  Age: 71 y.o. MRN: [id number]  Chief Complaint  Patient presents with  . Rash    Very pleasant 71 year old female presenting today for evaluation of a rash.  Approximately 6 weeks ago, she was seen for a rash that was affecting her posterior scalp and down onto her neck.  She was prescribed ketoconazole shampoo twice weekly for 8 weeks.  She has been using this and notes that the shampoo does seem to help for a very short while after using it but the relief is very temporary.  Has continued to have itching at the site but has now noticed that it affects her scalp around the frontal hairline as well.  {History (Optional):23778}  Review of Systems  Constitutional: Negative.   HENT: Negative.    Eyes: Negative.   Respiratory: Negative.    Cardiovascular: Negative.   Musculoskeletal: Negative.   Skin:  Positive for rash.  Neurological: Negative.       Objective:     BP (!) 151/70 (BP Location: Left Arm, Cuff Size: Normal)   Pulse 80   Resp 20   Ht 5' 2.5" (1.588 m)   Wt 76.8 kg   SpO2 98%   BMI 30.47 kg/m  {Vitals History (Optional):23777}  Physical Exam Constitutional:      Appearance: Normal appearance.  HENT:     Head: Normocephalic and atraumatic.  Cardiovascular:     Rate and Rhythm: Normal rate and regular rhythm.  Pulmonary:     Effort: Pulmonary effort is normal.  Skin:    General: Skin is warm and dry.     Findings: Rash present.  Neurological:     General: No focal deficit present.     Mental Status: She is alert and oriented to person, place, and time. Mental status is at baseline.     No results found for any visits on 07/12/22.  {Labs (Optional):23779}  The 10-year ASCVD risk score (Arnett DK, et al., 2019) is: 16.2%    Assessment & Plan:   Problem List Items Addressed This Visit   None Visit Diagnoses     Influenza vaccination declined     -  Primary       Return if symptoms worsen or fail to improve.    Ralph Leyden, RN

## 2022-07-26 ENCOUNTER — Encounter: Payer: Self-pay | Admitting: Medical-Surgical

## 2022-07-26 DIAGNOSIS — R21 Rash and other nonspecific skin eruption: Secondary | ICD-10-CM

## 2022-07-26 NOTE — Telephone Encounter (Signed)
Referral signed. ___________________________________________ Emanuella Nickle L. Torian Thoennes, DNP, APRN, FNP-BC Primary Care and Sports Medicine [facility]  

## 2022-08-05 DIAGNOSIS — H524 Presbyopia: Secondary | ICD-10-CM | POA: Diagnosis not present

## 2022-08-05 DIAGNOSIS — H02831 Dermatochalasis of right upper eyelid: Secondary | ICD-10-CM | POA: Diagnosis not present

## 2022-08-05 DIAGNOSIS — H02834 Dermatochalasis of left upper eyelid: Secondary | ICD-10-CM | POA: Diagnosis not present

## 2022-08-05 DIAGNOSIS — H527 Unspecified disorder of refraction: Secondary | ICD-10-CM | POA: Diagnosis not present

## 2022-08-05 DIAGNOSIS — H353132 Nonexudative age-related macular degeneration, bilateral, intermediate dry stage: Secondary | ICD-10-CM | POA: Diagnosis not present

## 2022-08-05 DIAGNOSIS — H26493 Other secondary cataract, bilateral: Secondary | ICD-10-CM | POA: Diagnosis not present

## 2022-08-08 ENCOUNTER — Encounter: Payer: Self-pay | Admitting: Medical-Surgical

## 2022-08-08 ENCOUNTER — Ambulatory Visit (INDEPENDENT_AMBULATORY_CARE_PROVIDER_SITE_OTHER): Payer: Medicare PPO | Admitting: Medical-Surgical

## 2022-08-08 VITALS — BP 139/72 | HR 80 | Ht 62.5 in | Wt 170.0 lb

## 2022-08-08 DIAGNOSIS — Z13820 Encounter for screening for osteoporosis: Secondary | ICD-10-CM | POA: Diagnosis not present

## 2022-08-08 DIAGNOSIS — Z1231 Encounter for screening mammogram for malignant neoplasm of breast: Secondary | ICD-10-CM

## 2022-08-08 DIAGNOSIS — Z0000 Encounter for general adult medical examination without abnormal findings: Secondary | ICD-10-CM | POA: Diagnosis not present

## 2022-08-08 NOTE — Progress Notes (Signed)
Complete physical exam  Patient: Carol Sanford   DOB: 1951-03-05   71 y.o. Female  MRN: [id number]  Subjective:    Chief Complaint  Patient presents with   Annual Exam    Carol Sanford is a 71 y.o. female who presents today for a complete physical exam. She reports consuming a general and mostly vegetarian diet. Gym/ health club routine includes treadmill, bike, strength training. She generally feels well. She reports sleeping well. She does not have additional problems to discuss today.    Most recent fall risk assessment:    05/23/2022    9:05 AM  Fall Risk   Falls in the past year? 0  Number falls in past yr: 0  Injury with Fall? 0  Risk for fall due to : No Fall Risks  Follow up Falls evaluation completed     Most recent depression screenings:    07/12/2022    1:17 PM 05/23/2022    9:05 AM  PHQ 2/9 Scores  PHQ - 2 Score 0 0    Vision:Within last year and Dental: No current dental problems and Receives regular dental care    Patient Care Team: Samuel Bouche, NP as PCP - General (Nurse Practitioner)   Outpatient Medications Prior to Visit  Medication Sig   Multiple Vitamins-Minerals (PRESERVISION AREDS 2) CAPS    B Complex-C (B-COMPLEX WITH VITAMIN C) tablet    cetirizine (ZYRTEC) 10 MG tablet Take 10 mg by mouth daily as needed.   clotrimazole-betamethasone (LOTRISONE) cream Apply 1 Application topically 2 (two) times daily.   Fexofenadine HCl (MUCINEX ALLERGY PO) Take by mouth. As needed.   Glucos-Chond-Hyal Ac-Ca Fructo (MOVE FREE JOINT HEALTH ADVANCE) TABS    ketoconazole (NIZORAL) 2 % shampoo Apply to scalp twice a week for 8 weeks and then weekly thereafter   losartan (COZAAR) 50 MG tablet Take 1 tablet (50 mg total) by mouth daily.   Vitamin D-Vitamin K (VITAMIN K2-VITAMIN D3 PO)    [DISCONTINUED] Glucosamine-Chondroitin 750-600 MG TABS Take 1 capsule by mouth daily.   No facility-administered medications prior to visit.   Review of Systems   Constitutional:  Negative for chills, fever, malaise/fatigue and weight loss.  HENT:  Negative for congestion, ear pain, hearing loss, sinus pain and sore throat.   Eyes:  Negative for blurred vision, photophobia and pain.  Respiratory:  Negative for cough, shortness of breath and wheezing.   Cardiovascular:  Negative for chest pain, palpitations and leg swelling.  Gastrointestinal:  Negative for abdominal pain, constipation, diarrhea, heartburn, nausea and vomiting.  Genitourinary:  Negative for dysuria, frequency and urgency.  Musculoskeletal:  Negative for falls and neck pain.  Skin:  Positive for rash (residual on back and scalp). Negative for itching.  Neurological:  Negative for dizziness, weakness and headaches.  Endo/Heme/Allergies:  Negative for polydipsia. Bruises/bleeds easily.  Psychiatric/Behavioral:  Negative for depression, substance abuse and suicidal ideas. The patient is not nervous/anxious and does not have insomnia.      Objective:    BP 139/72   Pulse 80   Ht 5' 2.5" (1.588 m)   Wt 170 lb (77.1 kg)   SpO2 96%   BMI 30.60 kg/m    Physical Exam Constitutional:      General: She is not in acute distress.    Appearance: Normal appearance. She is obese. She is not ill-appearing.  HENT:     Head: Normocephalic and atraumatic.     Right Ear: Tympanic membrane normal.     Left  Ear: Tympanic membrane normal.     Nose: Nose normal.     Mouth/Throat:     Mouth: Mucous membranes are moist.     Pharynx: No oropharyngeal exudate or posterior oropharyngeal erythema.  Eyes:     Extraocular Movements: Extraocular movements intact.     Conjunctiva/sclera: Conjunctivae normal.     Pupils: Pupils are equal, round, and reactive to light.  Neck:     Thyroid: No thyromegaly.     Vascular: No carotid bruit or JVD.     Trachea: Trachea normal.  Cardiovascular:     Rate and Rhythm: Normal rate and regular rhythm.     Pulses: Normal pulses.     Heart sounds: Normal heart  sounds. No murmur heard.    No friction rub. No gallop.  Pulmonary:     Effort: Pulmonary effort is normal. No respiratory distress.     Breath sounds: Normal breath sounds. No wheezing.  Abdominal:     General: Bowel sounds are normal. There is no distension.     Palpations: Abdomen is soft.     Tenderness: There is no abdominal tenderness. There is no guarding.  Musculoskeletal:        General: Normal range of motion.     Cervical back: Normal range of motion and neck supple.  Skin:    General: Skin is warm and dry.  Neurological:     Mental Status: She is alert and oriented to person, place, and time.     Cranial Nerves: No cranial nerve deficit.  Psychiatric:        Mood and Affect: Mood normal.        Behavior: Behavior normal.        Thought Content: Thought content normal.        Judgment: Judgment normal.    No results found for any visits on 08/08/22.     Assessment & Plan:    Routine Health Maintenance and Physical Exam  Immunization History  Administered Date(s) Administered   Fluad Quad(high Dose 65+) 06/29/2020, 07/03/2021   Influenza, High Dose Seasonal PF 06/19/2017, 06/16/2019   Influenza, Quadrivalent, Recombinant, Inj, Pf 07/09/2018   PFIZER(Purple Top)SARS-COV-2 Vaccination 11/10/2018, 12/09/2019, 07/17/2020, 04/13/2021   Pneumococcal Conjugate-13 12/15/2015   Pneumococcal Polysaccharide-23 06/19/2017   Tdap 06/19/2017   Zoster Recombinat (Shingrix) 06/02/2018, 09/22/2018    Health Maintenance  Topic Date Due   COVID-19 Vaccine (5 - Pfizer series) 10/05/2022 (Originally 06/08/2021)   INFLUENZA VACCINE  01/12/2023 (Originally 05/14/2022)   Medicare Annual Wellness (AWV)  08/08/2024 (Originally 03/05/2022)   DEXA SCAN  08/23/2022   MAMMOGRAM  09/05/2022   COLONOSCOPY (Pts 45-47yrs Insurance coverage will need to be confirmed)  10/15/2023   TETANUS/TDAP  06/20/2027   Pneumonia Vaccine 18+ Years old  Completed   Zoster Vaccines- Shingrix  Completed    HPV VACCINES  Aged Out   Hepatitis C Screening  Discontinued    Discussed health benefits of physical activity, and encouraged her to engage in regular exercise appropriate for her age and condition.  1. Annual physical exam Labs completed prior to the appointment and reviewed with patient.  Discussed elevations in cholesterol and recommendations for management.  We did discuss possibly completing a coronary calcium scan but she will think on this and let me know if she would like to proceed.  Up-to-date on preventative care.  Wellness information provided with AVS.  2. Encounter for screening mammogram for malignant neoplasm of breast Mammogram ordered. - MM DIGITAL SCREENING BILATERAL;  Future  3. Osteoporosis screening DEXA scan ordered.  Last result greater than 2 years ago with -2.5 T score indicating osteoporosis.  She is currently taking vitamin D however has not added calcium.  She does try to get calcium in her diet. - DG Bone Density; Future  Return in about 6 months (around 02/07/2023) for HTN follow up.   Christen Butter, NP

## 2022-08-12 DIAGNOSIS — L720 Epidermal cyst: Secondary | ICD-10-CM | POA: Diagnosis not present

## 2022-08-12 DIAGNOSIS — L400 Psoriasis vulgaris: Secondary | ICD-10-CM | POA: Diagnosis not present

## 2022-08-12 DIAGNOSIS — L245 Irritant contact dermatitis due to other chemical products: Secondary | ICD-10-CM | POA: Diagnosis not present

## 2022-09-11 ENCOUNTER — Ambulatory Visit (INDEPENDENT_AMBULATORY_CARE_PROVIDER_SITE_OTHER): Payer: Medicare PPO

## 2022-09-11 ENCOUNTER — Other Ambulatory Visit: Payer: Self-pay | Admitting: Medical-Surgical

## 2022-09-11 DIAGNOSIS — M810 Age-related osteoporosis without current pathological fracture: Secondary | ICD-10-CM

## 2022-09-11 DIAGNOSIS — Z13820 Encounter for screening for osteoporosis: Secondary | ICD-10-CM

## 2022-09-11 DIAGNOSIS — Z1231 Encounter for screening mammogram for malignant neoplasm of breast: Secondary | ICD-10-CM

## 2022-09-11 DIAGNOSIS — Z0000 Encounter for general adult medical examination without abnormal findings: Secondary | ICD-10-CM

## 2022-09-23 DIAGNOSIS — L245 Irritant contact dermatitis due to other chemical products: Secondary | ICD-10-CM | POA: Diagnosis not present

## 2022-10-16 ENCOUNTER — Encounter: Payer: Self-pay | Admitting: Family Medicine

## 2022-10-16 ENCOUNTER — Ambulatory Visit: Payer: Medicare PPO | Admitting: Family Medicine

## 2022-10-16 VITALS — BP 130/80 | HR 77 | Temp 97.9°F | Ht <= 58 in | Wt 168.1 lb

## 2022-10-16 DIAGNOSIS — R52 Pain, unspecified: Secondary | ICD-10-CM | POA: Diagnosis not present

## 2022-10-16 DIAGNOSIS — J069 Acute upper respiratory infection, unspecified: Secondary | ICD-10-CM

## 2022-10-16 DIAGNOSIS — J029 Acute pharyngitis, unspecified: Secondary | ICD-10-CM | POA: Diagnosis not present

## 2022-10-16 DIAGNOSIS — R6889 Other general symptoms and signs: Secondary | ICD-10-CM

## 2022-10-16 LAB — POCT RAPID STREP A (OFFICE): Rapid Strep A Screen: NEGATIVE

## 2022-10-16 LAB — POCT INFLUENZA A/B
Influenza A, POC: NEGATIVE
Influenza B, POC: NEGATIVE

## 2022-10-16 LAB — POC COVID19 BINAXNOW: SARS Coronavirus 2 Ag: NEGATIVE

## 2022-10-16 NOTE — Progress Notes (Unsigned)
Acute Office Visit  Subjective:     Patient ID: Carol Sanford, female    DOB: 1951/05/08, 72 y.o.   MRN: [id number]  Chief Complaint  Patient presents with   Sore Throat    Exposed to Strep throat this weekend.    Ear Pain   Generalized Body Aches    HPI Presents today for an acute visit with complaint of exposed to strep this past weekend. Has sore throat, body aches, bilateral ear pain. "Feels like flu"  Symptoms have been present for one day.  Associated symptoms include: chills Pertinent negatives: no fever no shortness of breath, able to swallow, no nausea or vomiting.  Pain severity: 5/10 throat pain  Treatments tried include : ibuprofen, vit c, rest Treatment effective : ibuprofen 400 mg helped pain Sick contacts : son sick, grandson was coughing.  Home Covid negative this morning.   Review of Systems  Constitutional:  Positive for chills and malaise/fatigue. Negative for fever.  HENT:  Positive for sore throat.   Respiratory:  Negative for shortness of breath.   Cardiovascular:  Negative for chest pain.  Gastrointestinal:  Negative for abdominal pain, nausea and vomiting.  Musculoskeletal:  Positive for myalgias.        Objective:    BP 130/80   Pulse 77   Temp 97.9 F (36.6 C) (Oral)   Ht 1' (0.305 m)   Wt 168 lb 1.6 oz (76.2 kg)   SpO2 99%   BMI 820.74 kg/m  {Vitals History (Optional):23777}  Physical Exam Vitals and nursing note reviewed.  Constitutional:      General: She is not in acute distress.    Appearance: She is well-developed. She is ill-appearing.  HENT:     Right Ear: Tympanic membrane normal.     Left Ear: Tympanic membrane normal.     Nose:     Right Turbinates: Swollen.     Left Turbinates: Swollen.     Right Sinus: Frontal sinus tenderness present. No maxillary sinus tenderness.     Left Sinus: Frontal sinus tenderness present. No maxillary sinus tenderness.     Mouth/Throat:     Mouth: Mucous membranes are moist.      Pharynx: Posterior oropharyngeal erythema present. No oropharyngeal exudate or uvula swelling.  Cardiovascular:     Rate and Rhythm: Regular rhythm.     Heart sounds: Normal heart sounds.  Pulmonary:     Effort: Pulmonary effort is normal.     Breath sounds: Normal breath sounds.  Skin:    General: Skin is warm and dry.  Neurological:     General: No focal deficit present.     Mental Status: She is alert. Mental status is at baseline.     Results for orders placed or performed in visit on 10/16/22  POCT rapid strep A  Result Value Ref Range   Rapid Strep A Screen Negative Negative  POC COVID-19  Result Value Ref Range   SARS Coronavirus 2 Ag Negative Negative  POCT Influenza A/B  Result Value Ref Range   Influenza A, POC Negative Negative   Influenza B, POC Negative Negative        Assessment & Plan:   Problem List Items Addressed This Visit     Generalized body aches   Relevant Orders   POC COVID-19 (Completed)   POCT Influenza A/B (Completed)   Flu-like symptoms   Relevant Orders   POC COVID-19 (Completed)   POCT Influenza A/B (Completed)   Sore throat  Relevant Orders   POCT rapid strep A (Completed)   POC COVID-19 (Completed)   POCT Influenza A/B (Completed)   Viral upper respiratory tract infection - Primary  Exposed to strep this past weekend. Throat erythematous, no exudate. Able to swallow and keeping fluids down. Lungs clear, oxygen saturation 99%.  Generalized body aches and bilateral ear discomfort. TMs normal. "It feels like the flu" Rapid strep negative. Rapid Covid negative Rapid influenza influenza Symptoms consistent with URI. Supportive and symptomatic treatment. Has Flonase at home. Continue with ibuprofen for body aches.  Fluids and rest.  She will follow-up if her symptoms worsen or fail to improve.  Agrees with plan of care discussed today. Questions answered.      No orders of the defined types were placed in this  encounter.   No follow-ups on file.  Chalmers Guest, FNP

## 2022-10-16 NOTE — Patient Instructions (Signed)
Viral illnesses can take up to 10 days to resolve.   There is no role for an antibiotic.  Symptomatic treatment is ideal.   Take over the counter pain medication as needed. Acetaminophen and ibuprofen for fever and body aches. Mucinex and Robitussin for cough, if you have high blood pressure, take Coricidin for cough. Honey is also effective for cough, avoid if diabetic. Flonase or saline nasal spray for nasal congestion.    Read and follow instructions on the label and make sure not to combine other medications that may have same ingredients in it. It is important to not take too much.    Drink plenty of caffeine-free fluids. (If you have heart or kidney problems, follow the instructions of your specialist regarding amounts). Adequate fluids will help you to avoid dehydration.   If you are hungry, eat a bland diet, such as the BRAT diet (bananas, rice, applesauce, toast). Diet as tolerated if appetite is normal.   Get lots of rest.  Let us know if you are not improving or getting worse.

## 2022-10-17 ENCOUNTER — Ambulatory Visit: Payer: Medicare PPO | Admitting: Family Medicine

## 2022-10-29 ENCOUNTER — Telehealth: Payer: Self-pay | Admitting: Medical-Surgical

## 2022-10-29 NOTE — Telephone Encounter (Signed)
Patient was seen previously by Santiago Glad at [facility] and not getting any better . She wants to know if antibiotics can be sent to pharmacy at this time?

## 2022-10-29 NOTE — Telephone Encounter (Signed)
I called patient she stated she's not sure why would she need to be seen again and she is not up to that. She just needs antibiotic.  I did relay your message as well.

## 2022-12-03 ENCOUNTER — Telehealth: Payer: Self-pay | Admitting: Medical-Surgical

## 2022-12-03 NOTE — Telephone Encounter (Signed)
Called patient to schedule Medicare Annual Wellness Visit (AWV). Left message for patient to call back and schedule Medicare Annual Wellness Visit (AWV).  Last date of AWV: 03/05/21  Please schedule an appointment at any time with nURSE hEALTH aDVISOR.  If any questions, please contact me at [phone].  Thank you ,  Lin Givens Patient Access Advocate II Direct Dial: [phone]

## 2023-05-14 ENCOUNTER — Telehealth: Payer: Self-pay | Admitting: Medical-Surgical

## 2023-05-14 NOTE — Telephone Encounter (Signed)
Pt called.  She wants to start seeing Dr. Tamera Punt because her previous pcp was a doctor.  Are you both okay with patient switching?

## 2023-06-23 DIAGNOSIS — H0014 Chalazion left upper eyelid: Secondary | ICD-10-CM | POA: Diagnosis not present

## 2023-07-13 ENCOUNTER — Other Ambulatory Visit: Payer: Self-pay | Admitting: Medical-Surgical

## 2023-07-13 DIAGNOSIS — I10 Essential (primary) hypertension: Secondary | ICD-10-CM

## 2023-07-17 ENCOUNTER — Encounter: Payer: Self-pay | Admitting: Medical-Surgical

## 2023-07-17 DIAGNOSIS — I10 Essential (primary) hypertension: Secondary | ICD-10-CM

## 2023-07-18 MED ORDER — LOSARTAN POTASSIUM 50 MG PO TABS: 50.0000 mg | ORAL_TABLET | Freq: Every day | ORAL | 0 refills | Status: DC

## 2023-08-07 DIAGNOSIS — H02403 Unspecified ptosis of bilateral eyelids: Secondary | ICD-10-CM | POA: Diagnosis not present

## 2023-08-07 DIAGNOSIS — H353132 Nonexudative age-related macular degeneration, bilateral, intermediate dry stage: Secondary | ICD-10-CM | POA: Diagnosis not present

## 2023-08-07 DIAGNOSIS — H04123 Dry eye syndrome of bilateral lacrimal glands: Secondary | ICD-10-CM | POA: Diagnosis not present

## 2023-08-07 DIAGNOSIS — H02831 Dermatochalasis of right upper eyelid: Secondary | ICD-10-CM | POA: Diagnosis not present

## 2023-08-07 DIAGNOSIS — H26493 Other secondary cataract, bilateral: Secondary | ICD-10-CM | POA: Diagnosis not present

## 2023-08-07 DIAGNOSIS — H02834 Dermatochalasis of left upper eyelid: Secondary | ICD-10-CM | POA: Diagnosis not present

## 2023-08-07 DIAGNOSIS — Z961 Presence of intraocular lens: Secondary | ICD-10-CM | POA: Diagnosis not present

## 2023-08-07 DIAGNOSIS — H43813 Vitreous degeneration, bilateral: Secondary | ICD-10-CM | POA: Diagnosis not present

## 2023-08-07 DIAGNOSIS — H35371 Puckering of macula, right eye: Secondary | ICD-10-CM | POA: Diagnosis not present

## 2023-08-07 IMAGING — MG MM DIGITAL SCREENING BILAT W/ TOMO AND CAD
8 series · 9 of 24 positions shown · non-contrast
Comparison: Previous exam(s).

CLINICAL DATA: Screening.

EXAM:
DIGITAL SCREENING BILATERAL MAMMOGRAM WITH TOMOSYNTHESIS AND CAD
TECHNIQUE: Bilateral screening digital craniocaudal and mediolateral oblique
mammograms were obtained. Bilateral screening digital breast
tomosynthesis was performed. The images were evaluated with
computer-aided detection.

[R CC synth-2D]
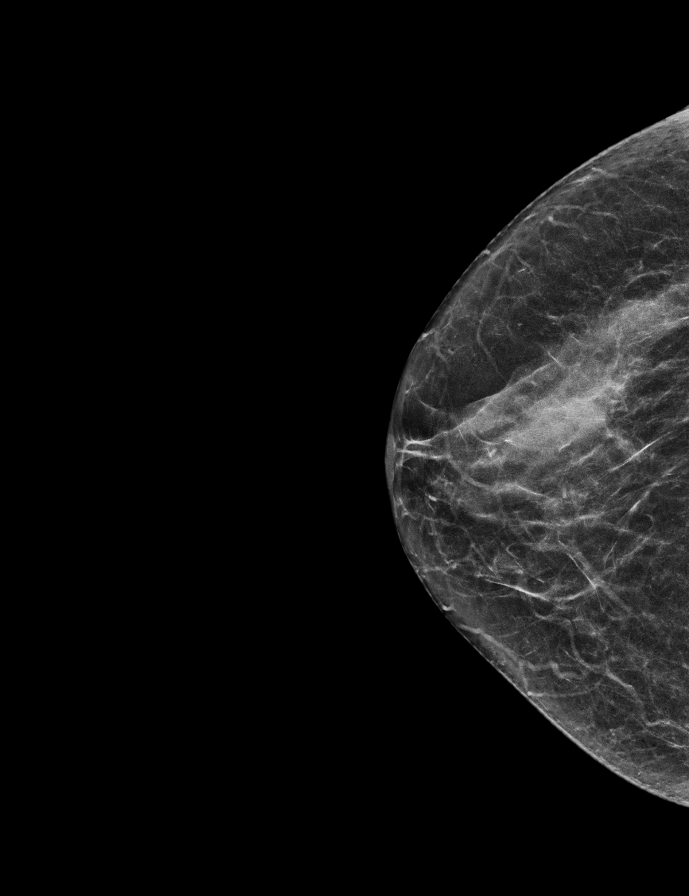

[L MLO synth-2D]
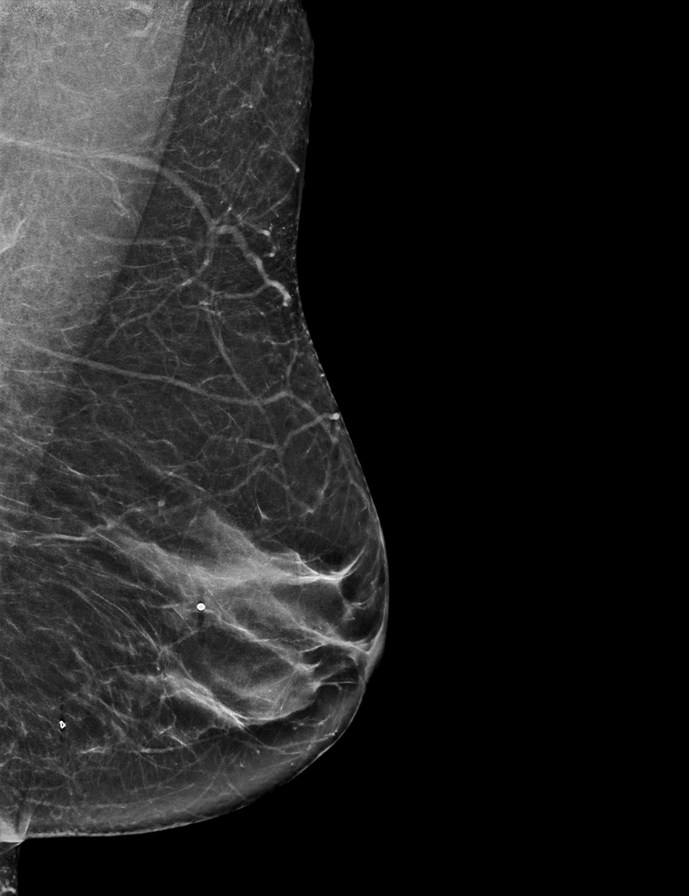

[R MLO synth-2D]
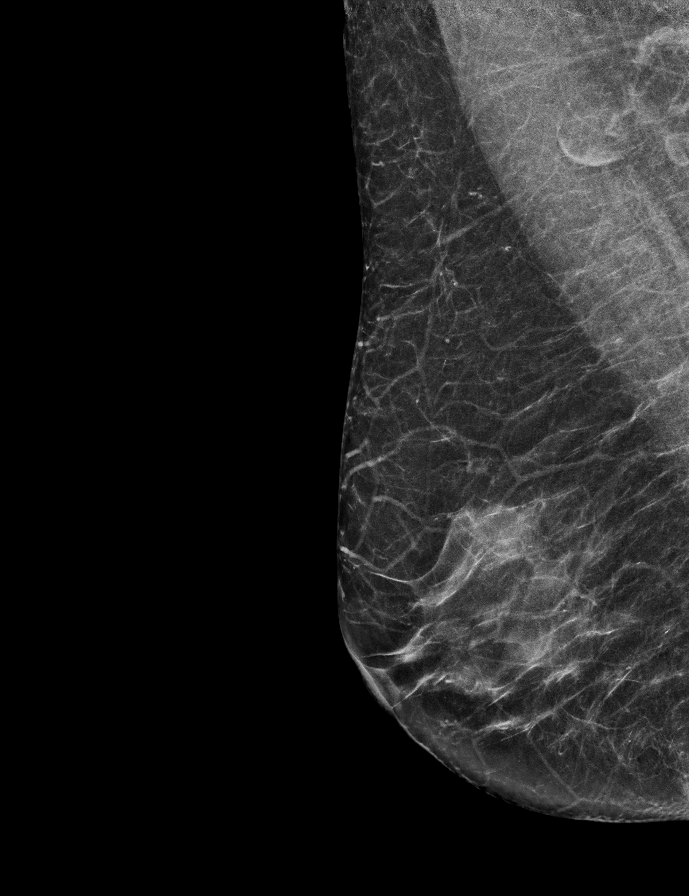

[L CC synth-2D]
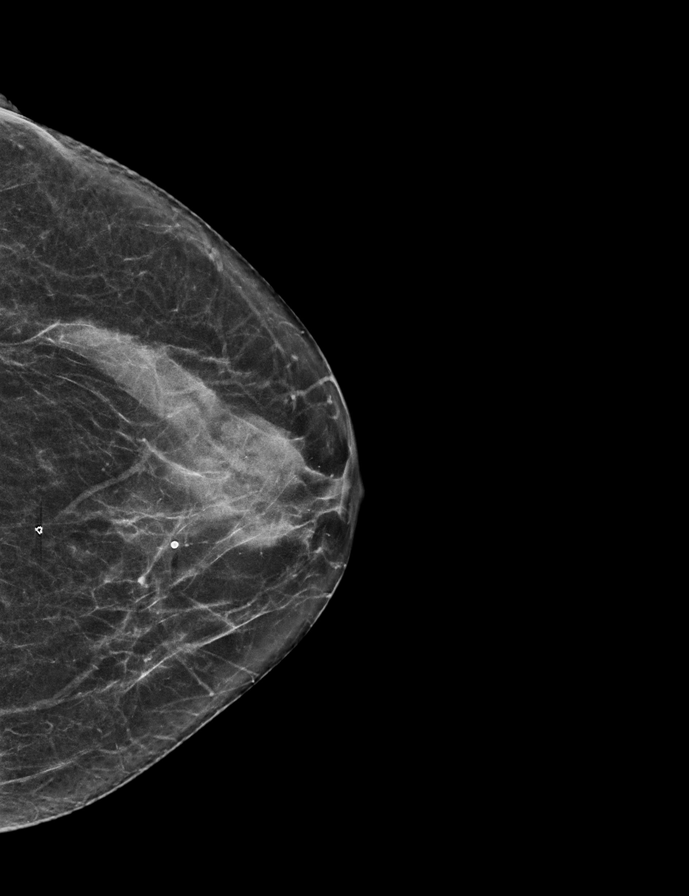

[L MLO tomo · 2 of 60 frames shown]
[frame 20/60]
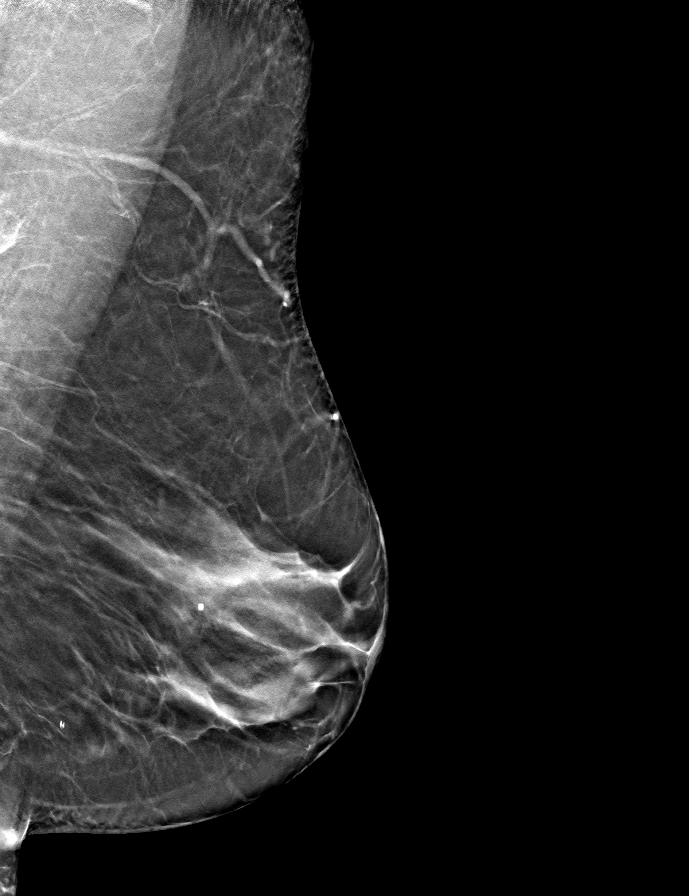
[frame 31/60]
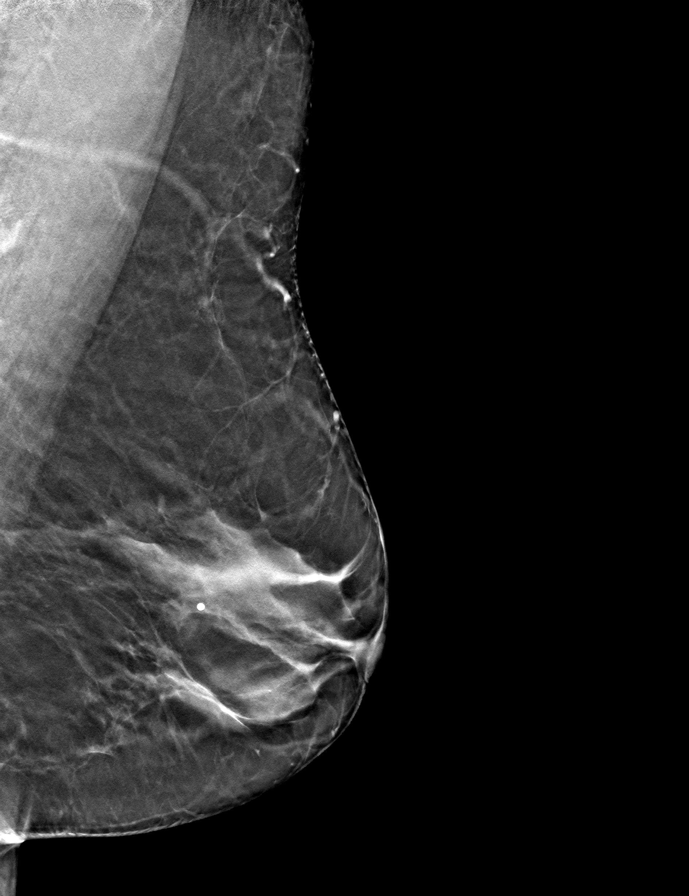

[L CC tomo · tomo slice 28/55.0]
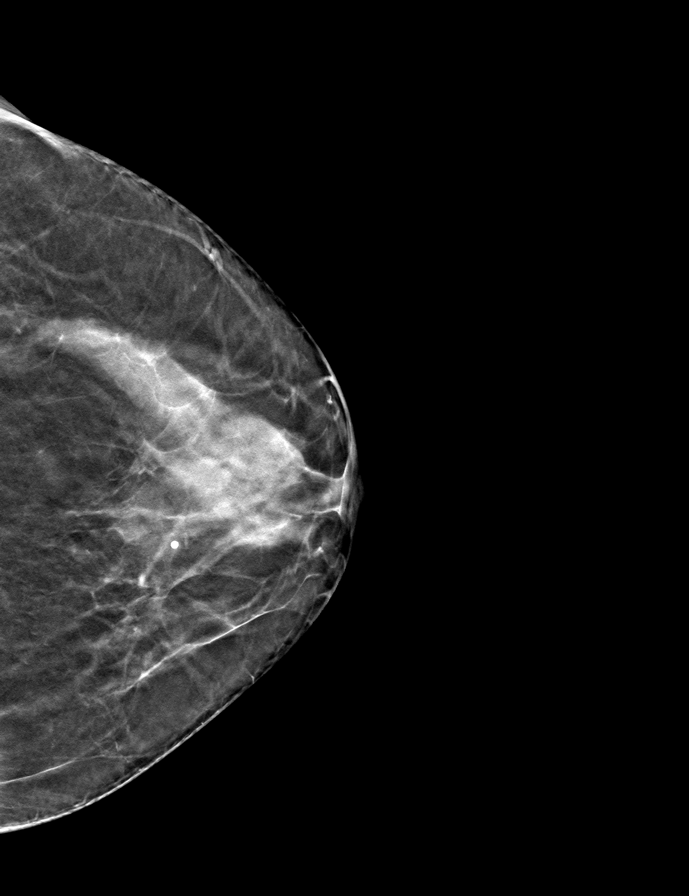

[R CC tomo · tomo slice 27/52.0]
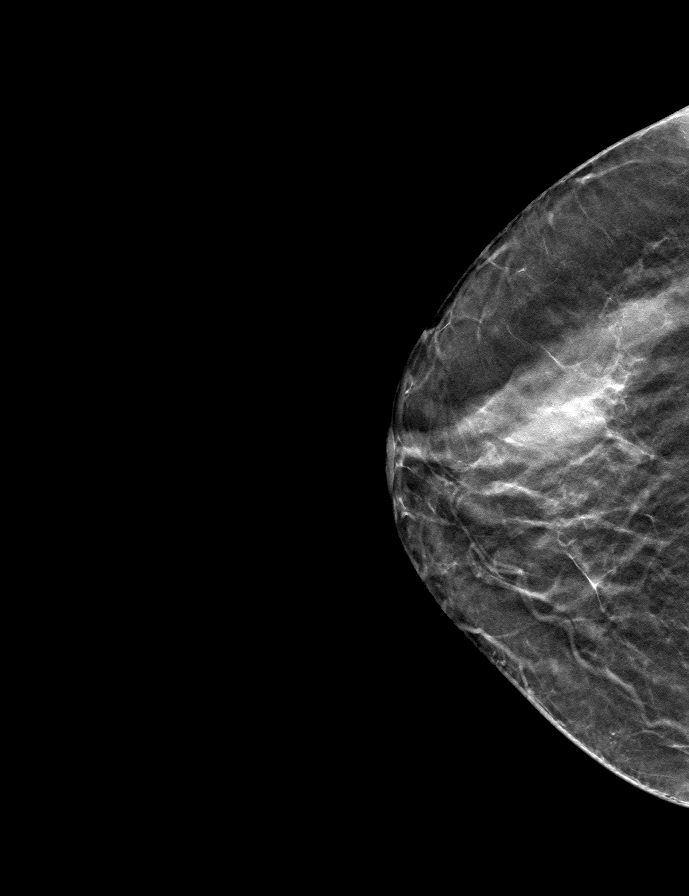

[R MLO tomo · tomo slice 29/57.0]
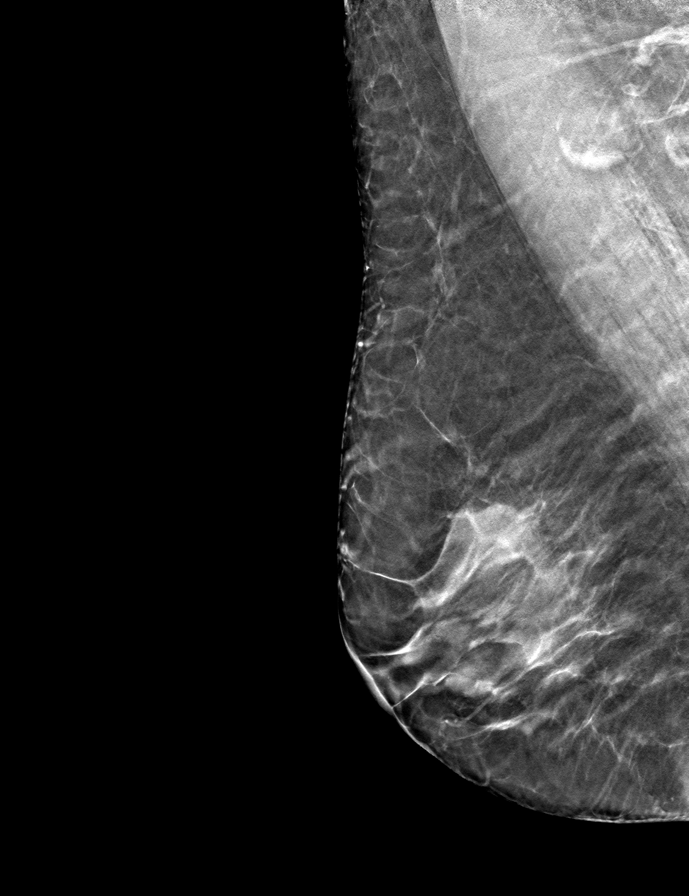

[9 of 24 positions shown; findings below may reference images not displayed]

ACR Breast Density Category c: The breast tissue is heterogeneously
dense, which may obscure small masses.
FINDINGS: There are no findings suspicious for malignancy.
IMPRESSION: No mammographic evidence of malignancy. A result letter of this
screening mammogram will be mailed directly to the patient.

RECOMMENDATION:
Screening mammogram in one year. (Code:Q3-W-BC3)

BI-RADS CATEGORY  1: Negative.

## 2023-08-11 ENCOUNTER — Ambulatory Visit (INDEPENDENT_AMBULATORY_CARE_PROVIDER_SITE_OTHER): Payer: Medicare PPO | Admitting: Family Medicine

## 2023-08-11 ENCOUNTER — Encounter: Payer: Self-pay | Admitting: Family Medicine

## 2023-08-11 VITALS — BP 150/77 | HR 78 | Ht 63.5 in | Wt 166.5 lb

## 2023-08-11 DIAGNOSIS — I10 Essential (primary) hypertension: Secondary | ICD-10-CM | POA: Insufficient documentation

## 2023-08-11 DIAGNOSIS — Z1211 Encounter for screening for malignant neoplasm of colon: Secondary | ICD-10-CM | POA: Diagnosis not present

## 2023-08-11 DIAGNOSIS — Z1231 Encounter for screening mammogram for malignant neoplasm of breast: Secondary | ICD-10-CM | POA: Diagnosis not present

## 2023-08-11 DIAGNOSIS — Z0000 Encounter for general adult medical examination without abnormal findings: Secondary | ICD-10-CM

## 2023-08-11 NOTE — Assessment & Plan Note (Signed)
Doing well, bp slightly elevated today but for age seems to be ok. Will repeat

## 2023-08-11 NOTE — Progress Notes (Addendum)
Established patient visit   Patient: Carol Sanford   DOB: [birth year]   72 y.o. Female  MRN: [id number] Visit Date: 08/11/2023  Today's healthcare provider: Charlton Amor, DO   Chief Complaint  Patient presents with   Annual Exam    SUBJECTIVE    Chief Complaint  Patient presents with   Annual Exam   HPI   Pt presents for wellness exam today.   Due for colon cancer screening. Has had two colonoscopies in the past that have been negative. Is interested in cologuard.   Due for mammogram in November.   Husband recently diagnosed with dementia and this has been hard for her.   Review of Systems  Constitutional:  Negative for activity change, fatigue and fever.  Respiratory:  Negative for cough and shortness of breath.   Cardiovascular:  Negative for chest pain.  Gastrointestinal:  Negative for abdominal pain.  Genitourinary:  Negative for difficulty urinating.       Current Meds  Medication Sig   B Complex-C (B-COMPLEX WITH VITAMIN C) tablet    Calcium Carb-Cholecalciferol (CALCIUM 500 + D PO) Take by mouth.   cetirizine (ZYRTEC) 10 MG tablet Take 10 mg by mouth daily as needed.   clobetasol (TEMOVATE) 0.05 % external solution Apply topically.   clotrimazole-betamethasone (LOTRISONE) cream Apply 1 Application topically 2 (two) times daily.   Glucos-Chond-Hyal Ac-Ca Fructo (MOVE FREE JOINT HEALTH ADVANCE) TABS    Multiple Vitamins-Minerals (PRESERVISION AREDS 2) CAPS    triamcinolone cream (KENALOG) 0.1 % SMARTSIG:1 Application Topical 2-3 Times Daily   Vitamin D-Vitamin K (VITAMIN K2-VITAMIN D3 PO)    [DISCONTINUED] losartan (COZAAR) 50 MG tablet Take 1 tablet (50 mg total) by mouth daily.    OBJECTIVE    BP (!) 150/77   Pulse 78   Ht 5' 3.5" (1.613 m)   Wt 166 lb 8 oz (75.5 kg)   SpO2 100%   BMI 29.03 kg/m   Physical Exam Vitals and nursing note reviewed.  Constitutional:      General: She is not in acute distress.    Appearance: Normal appearance.   HENT:     Head: Normocephalic and atraumatic.     Right Ear: External ear normal.     Left Ear: External ear normal.     Nose: Nose normal.  Eyes:     Conjunctiva/sclera: Conjunctivae normal.  Cardiovascular:     Rate and Rhythm: Normal rate and regular rhythm.  Pulmonary:     Effort: Pulmonary effort is normal.     Breath sounds: Normal breath sounds.  Neurological:     General: No focal deficit present.     Mental Status: She is alert and oriented to person, place, and time.  Psychiatric:        Mood and Affect: Mood normal.        Behavior: Behavior normal.        Thought Content: Thought content normal.        Judgment: Judgment normal.        ASSESSMENT & PLAN    Problem List Items Addressed This Visit       Cardiovascular and Mediastinum   Essential hypertension - Primary    Doing well, bp slightly elevated today but for age seems to be ok. Will repeat      Relevant Orders   Lipid panel (Completed)   CMP14+EGFR (Completed)   CBC w/Diff/Platelet (Completed)     Other   Encounter for Medicare annual  wellness exam   Other Visit Diagnoses     Screening for colon cancer       Relevant Orders   Cologuard (Completed)   Encounter for screening mammogram for malignant neoplasm of breast       Relevant Orders   MM 3D SCREENING MAMMOGRAM BILATERAL BREAST      She is doing ok with husband's new diagnosis and [facility] is [facility] and has resources in place. We discussed that if she needs anything she needs to let us know and we would be happy to address. May need to go back on antidepressants which we discussed can be done via mychart video visit if needed    No follow-ups on file.      No orders of the defined types were placed in this encounter.   Orders Placed This Encounter  Procedures   MM 3D SCREENING MAMMOGRAM BILATERAL BREAST    Standing Status:   Future    Standing Expiration Date:   08/10/2024    Order Specific Question:   Reason for Exam  (SYMPTOM  OR DIAGNOSIS REQUIRED)    Answer:   screening    Order Specific Question:   Preferred imaging location?    Answer:   [facility]    Order Specific Question:   Release to patient    Answer:   Immediate   Lipid panel    Order Specific Question:   Has the patient fasted?    Answer:   No    Order Specific Question:   Release to patient    Answer:   Immediate   CMP14+EGFR    Order Specific Question:   Has the patient fasted?    Answer:   No   CBC w/Diff/Platelet   Cologuard     Charlton Amor, DO  [facility] at [facility] [phone] (phone) [phone] (fax)  [facility]

## 2023-08-18 ENCOUNTER — Other Ambulatory Visit: Payer: Self-pay

## 2023-08-18 ENCOUNTER — Telehealth: Payer: Self-pay | Admitting: Family Medicine

## 2023-08-18 DIAGNOSIS — I10 Essential (primary) hypertension: Secondary | ICD-10-CM | POA: Diagnosis not present

## 2023-08-18 MED ORDER — LOSARTAN POTASSIUM 50 MG PO TABS
50.0000 mg | ORAL_TABLET | Freq: Every day | ORAL | 0 refills | Status: DC
Start: 2023-08-18 — End: 2023-08-21

## 2023-08-18 NOTE — Telephone Encounter (Signed)
Refill sent. Thanks Roselyn Reef, CMA

## 2023-08-18 NOTE — Telephone Encounter (Signed)
Pt was seen on 08/11/23 and states her prescription for Losartan forgot to get called in for 90 days to [facility].

## 2023-08-19 LAB — CMP14+EGFR
ALT: 19 [IU]/L (ref 0–32)
AST: 20 [IU]/L (ref 0–40)
Albumin: 4.7 g/dL (ref 3.8–4.8)
Alkaline Phosphatase: 70 [IU]/L (ref 44–121)
BUN/Creatinine Ratio: 20 (ref 12–28)
BUN: 16 mg/dL (ref 8–27)
Bilirubin Total: 0.6 mg/dL (ref 0.0–1.2)
CO2: 25 mmol/L (ref 20–29)
Calcium: 9.8 mg/dL (ref 8.7–10.3)
Chloride: 103 mmol/L (ref 96–106)
Creatinine, Ser: 0.8 mg/dL (ref 0.57–1.00)
Globulin, Total: 2.2 g/dL (ref 1.5–4.5)
Glucose: 89 mg/dL (ref 70–99)
Potassium: 4.2 mmol/L (ref 3.5–5.2)
Sodium: 142 mmol/L (ref 134–144)
Total Protein: 6.9 g/dL (ref 6.0–8.5)
eGFR: 79 mL/min/{1.73_m2} (ref >59)

## 2023-08-19 LAB — CBC WITH DIFFERENTIAL/PLATELET
Basophils Absolute: 0 10*3/uL (ref 0.0–0.2)
Basos: 1 %
EOS (ABSOLUTE): 0.2 10*3/uL (ref 0.0–0.4)
Eos: 6 %
Hematocrit: 40 % (ref 34.0–46.6)
Hemoglobin: 13.2 g/dL (ref 11.1–15.9)
Immature Grans (Abs): 0 10*3/uL (ref 0.0–0.1)
Immature Granulocytes: 0 %
Lymphocytes Absolute: 0.8 10*3/uL (ref 0.7–3.1)
Lymphs: 27 %
MCH: 31.5 pg (ref 26.6–33.0)
MCHC: 33 g/dL (ref 31.5–35.7)
MCV: 96 fL (ref 79–97)
Monocytes Absolute: 0.2 10*3/uL (ref 0.1–0.9)
Monocytes: 8 %
Neutrophils Absolute: 1.7 10*3/uL (ref 1.4–7.0)
Neutrophils: 58 %
Platelets: 195 10*3/uL (ref 150–450)
RBC: 4.19 x10E6/uL (ref 3.77–5.28)
RDW: 12.4 % (ref 11.7–15.4)
WBC: 2.9 10*3/uL — ABNORMAL LOW (ref 3.4–10.8)

## 2023-08-19 LAB — LIPID PANEL
Chol/HDL Ratio: 3.2 ratio (ref 0.0–4.4)
Cholesterol, Total: 232 mg/dL — ABNORMAL HIGH (ref 100–199)
HDL: 73 mg/dL (ref >39)
LDL Chol Calc (NIH): 137 mg/dL — ABNORMAL HIGH (ref 0–99)
Triglycerides: 128 mg/dL (ref 0–149)
VLDL Cholesterol Cal: 22 mg/dL (ref 5–40)

## 2023-08-20 ENCOUNTER — Encounter: Payer: Self-pay | Admitting: Family Medicine

## 2023-08-20 DIAGNOSIS — I10 Essential (primary) hypertension: Secondary | ICD-10-CM

## 2023-08-21 MED ORDER — LOSARTAN POTASSIUM 50 MG PO TABS: 50.0000 mg | ORAL_TABLET | Freq: Every day | ORAL | 1 refills | Status: DC

## 2023-09-03 DIAGNOSIS — Z1211 Encounter for screening for malignant neoplasm of colon: Secondary | ICD-10-CM | POA: Diagnosis not present

## 2023-09-11 LAB — COLOGUARD: COLOGUARD: NEGATIVE

## 2023-09-16 ENCOUNTER — Telehealth: Payer: Self-pay | Admitting: Family Medicine

## 2023-09-16 NOTE — Telephone Encounter (Signed)
Copied from CRM [id number]. Topic: Medical Record Request - Other >> Sep 16, 2023 11:37 AM Hector Shade B wrote: Reason for CRM: Patient has requested that medical records would be faxed over to Medicare in regards to her annual medicare wellness visit it was listed on her visit 08/11/23 as just an annual visit patient stated it needed to be corrected and sent over to medicare in order to receive credit for that visit.

## 2023-09-17 ENCOUNTER — Encounter: Payer: Self-pay | Admitting: Family Medicine

## 2023-09-17 DIAGNOSIS — Z0000 Encounter for general adult medical examination without abnormal findings: Secondary | ICD-10-CM | POA: Insufficient documentation

## 2023-09-17 HISTORY — DX: Encounter for general adult medical examination without abnormal findings: Z00.00

## 2023-09-17 NOTE — Addendum Note (Signed)
Addended by: Charlton Amor on: 09/17/2023 02:37 PM   Modules accepted: Level of Service

## 2023-09-24 ENCOUNTER — Ambulatory Visit: Payer: Medicare PPO

## 2023-09-24 DIAGNOSIS — Z1231 Encounter for screening mammogram for malignant neoplasm of breast: Secondary | ICD-10-CM | POA: Diagnosis not present

## 2023-12-17 ENCOUNTER — Other Ambulatory Visit: Payer: Self-pay

## 2023-12-17 DIAGNOSIS — I10 Essential (primary) hypertension: Secondary | ICD-10-CM

## 2023-12-17 MED ORDER — LOSARTAN POTASSIUM 50 MG PO TABS: 50.0000 mg | ORAL_TABLET | Freq: Every day | ORAL | 0 refills | Status: DC

## 2024-02-14 ENCOUNTER — Other Ambulatory Visit: Payer: Self-pay | Admitting: Family Medicine

## 2024-02-14 DIAGNOSIS — I10 Essential (primary) hypertension: Secondary | ICD-10-CM

## 2024-02-19 ENCOUNTER — Encounter: Payer: Self-pay | Admitting: Family Medicine

## 2024-06-15 ENCOUNTER — Encounter: Payer: Self-pay | Admitting: Sports Medicine

## 2024-08-16 DIAGNOSIS — H353132 Nonexudative age-related macular degeneration, bilateral, intermediate dry stage: Secondary | ICD-10-CM | POA: Diagnosis not present

## 2024-08-16 DIAGNOSIS — H527 Unspecified disorder of refraction: Secondary | ICD-10-CM | POA: Diagnosis not present

## 2024-08-16 DIAGNOSIS — H26493 Other secondary cataract, bilateral: Secondary | ICD-10-CM | POA: Diagnosis not present

## 2024-08-16 DIAGNOSIS — H02834 Dermatochalasis of left upper eyelid: Secondary | ICD-10-CM | POA: Diagnosis not present

## 2024-08-16 DIAGNOSIS — H04123 Dry eye syndrome of bilateral lacrimal glands: Secondary | ICD-10-CM | POA: Diagnosis not present

## 2024-08-16 DIAGNOSIS — H35371 Puckering of macula, right eye: Secondary | ICD-10-CM | POA: Diagnosis not present

## 2024-08-16 DIAGNOSIS — H02831 Dermatochalasis of right upper eyelid: Secondary | ICD-10-CM | POA: Diagnosis not present

## 2024-08-19 ENCOUNTER — Ambulatory Visit (INDEPENDENT_AMBULATORY_CARE_PROVIDER_SITE_OTHER): Admitting: Urgent Care

## 2024-08-19 ENCOUNTER — Encounter: Payer: Self-pay | Admitting: Urgent Care

## 2024-08-19 VITALS — BP 137/75 | HR 72 | Ht 63.5 in | Wt 167.0 lb

## 2024-08-19 DIAGNOSIS — Z23 Encounter for immunization: Secondary | ICD-10-CM | POA: Diagnosis not present

## 2024-08-19 DIAGNOSIS — D72819 Decreased white blood cell count, unspecified: Secondary | ICD-10-CM | POA: Diagnosis not present

## 2024-08-19 DIAGNOSIS — Z0000 Encounter for general adult medical examination without abnormal findings: Secondary | ICD-10-CM | POA: Diagnosis not present

## 2024-08-19 DIAGNOSIS — M810 Age-related osteoporosis without current pathological fracture: Secondary | ICD-10-CM | POA: Insufficient documentation

## 2024-08-19 DIAGNOSIS — Z1231 Encounter for screening mammogram for malignant neoplasm of breast: Secondary | ICD-10-CM

## 2024-08-19 DIAGNOSIS — I10 Essential (primary) hypertension: Secondary | ICD-10-CM | POA: Diagnosis not present

## 2024-08-19 MED ORDER — LOSARTAN POTASSIUM 50 MG PO TABS: 50.0000 mg | ORAL_TABLET | Freq: Every day | ORAL | 3 refills | Status: AC

## 2024-08-19 MED ORDER — CALCIUM 600+D PLUS MINERALS 600-400 MG-UNIT PO TABS: 1.0000 | ORAL_TABLET | Freq: Two times a day (BID) | ORAL | 3 refills | Status: AC

## 2024-08-19 NOTE — Patient Instructions (Addendum)
 Please schedule your mammogram and bone density.  Start taking the calcium and Vit D called in today twice daily. Continue your losartan  daily.  Consider looking into a personal trainer to help you execute your weight lifting activities to maximize your results.  Return annually, sooner as needed

## 2024-08-19 NOTE — Progress Notes (Unsigned)
 Complete physical exam  Patient: Carol Sanford   DOB: 04-Mar-1951   73 y.o. Female  MRN: [id number]  Subjective:    Chief Complaint  Patient presents with   Annual Exam    Small scratchy throat - not really bothersome     Carol Sanford is a 73 y.o. female who presents today for a complete physical exam. She reports consuming a general diet. Gym/ health club routine includes bike and light weight lifting 3 days per week at the gym. Walks daily. She generally feels well. She reports sleeping well. She does not have additional problems to discuss today.   Due for mammogram and DEXA Ostepenia in 2019, osteoporosis in 2021 and 2023. Pt hesitant to start medications. Would like to lose weight.   Most recent fall risk assessment:    05/23/2022    9:05 AM  Fall Risk   Falls in the past year? 0  Number falls in past yr: 0  Injury with Fall? 0  Risk for fall due to : No Fall Risks  Follow up Falls evaluation completed      Data saved with a previous flowsheet row definition     Most recent depression screenings:    07/12/2022    1:17 PM 05/23/2022    9:05 AM  PHQ 2/9 Scores  PHQ - 2 Score 0 0    Vision:Within last year and Dental: No current dental problems and Receives regular dental care  Patient Active Problem List   Diagnosis Date Noted   Leukopenia 08/19/2024   Age-related osteoporosis without current pathological fracture 08/19/2024   Encounter for Medicare annual wellness exam 09/17/2023   Essential hypertension 08/11/2023   Generalized body aches 10/16/2022   Flu-like symptoms 10/16/2022   Sore throat 10/16/2022   Viral upper respiratory tract infection 10/16/2022   Intertrigo inframammary folds 12/24/2021   Skin lesion of neck 08/11/2018   Plantar fasciitis, left 06/19/2017   History of osteopenia 06/19/2017   Hypertension    Combined forms of age-related cataract of left eye 04/03/2017   PVD (posterior vitreous detachment), left eye 12/06/2013   Regular  astigmatism 12/06/2013   Senile incipient cataract 12/06/2013   Myopia 12/06/2013   Past Medical History:  Diagnosis Date   Age-related osteoporosis without current pathological fracture 08/19/2024   Allergy Seasonal   Arthritis Unsure   Cataract Both removed   Encounter for Medicare annual wellness exam 09/17/2023   Hypertension    Past Surgical History:  Procedure Laterality Date   APPENDECTOMY  1965   BREAST BIOPSY Left    benign 9 to 10 years ago    EYE SURGERY Left 04/03/2017   Cataract   HYSTEROSCOPY  2000   KNEE SURGERY Left 1997   Social History   Tobacco Use   Smoking status: Never   Smokeless tobacco: Never  Vaping Use   Vaping status: Never Used  Substance Use Topics   Alcohol use: Yes    Alcohol/week: 2.0 standard drinks of alcohol    Types: 2 Glasses of wine per week   Drug use: Never      Patient Care Team: Lowella Benton CROME, [place] as PCP - General (Physician Assistant)   Outpatient Medications Prior to Visit  Medication Sig   B Complex-C (B-COMPLEX WITH VITAMIN C) tablet    cetirizine (ZYRTEC) 10 MG tablet Take 10 mg by mouth daily as needed.   Glucos-Chond-Hyal Ac-Ca Fructo (MOVE FREE JOINT HEALTH ADVANCE) TABS    Multiple Vitamins-Minerals (PRESERVISION AREDS 2)  CAPS    Vitamin D -Vitamin K (VITAMIN K2-VITAMIN D3 PO)    [DISCONTINUED] Calcium Carb-Cholecalciferol (CALCIUM 500 + D PO) Take by mouth.   [DISCONTINUED] losartan  (COZAAR ) 50 MG tablet TAKE 1 TABLET EVERY DAY   [DISCONTINUED] clobetasol (TEMOVATE) 0.05 % external solution Apply topically.   [DISCONTINUED] clotrimazole -betamethasone  (LOTRISONE ) cream Apply 1 Application topically 2 (two) times daily.   [DISCONTINUED] triamcinolone  cream (KENALOG ) 0.1 % SMARTSIG:1 Application Topical 2-3 Times Daily   No facility-administered medications prior to visit.    ROS Complete 12 point ROS performed with all pertinent positives listed in HPI     Objective:     BP 137/75 (BP Location: Left Arm,  Patient Position: Sitting, Cuff Size: Normal)   Pulse 72   Ht 5' 3.5 (1.613 m)   Wt 167 lb (75.8 kg)   SpO2 99%   BMI 29.12 kg/m  BP Readings from Last 3 Encounters:  08/19/24 137/75  08/11/23 (!) 150/77  10/16/22 130/80   Wt Readings from Last 3 Encounters:  08/19/24 167 lb (75.8 kg)  08/11/23 166 lb 8 oz (75.5 kg)  10/16/22 168 lb 1.6 oz (76.2 kg)      Physical Exam Vitals and nursing note reviewed.  Constitutional:      General: She is not in acute distress.    Appearance: Normal appearance. She is not ill-appearing, toxic-appearing or diaphoretic.  HENT:     Head: Normocephalic and atraumatic.     Right Ear: Tympanic membrane, ear canal and external ear normal. There is no impacted cerumen.     Left Ear: Tympanic membrane, ear canal and external ear normal. There is no impacted cerumen.     Nose: Nose normal.     Mouth/Throat:     Mouth: Mucous membranes are moist.     Pharynx: Oropharynx is clear. No oropharyngeal exudate or posterior oropharyngeal erythema.  Eyes:     General: No scleral icterus.       Right eye: No discharge.        Left eye: No discharge.     Extraocular Movements: Extraocular movements intact.     Pupils: Pupils are equal, round, and reactive to light.  Neck:     Thyroid: No thyroid mass, thyromegaly or thyroid tenderness.  Cardiovascular:     Rate and Rhythm: Normal rate and regular rhythm.     Pulses: Normal pulses.     Heart sounds: No murmur heard. Pulmonary:     Effort: Pulmonary effort is normal. No respiratory distress.     Breath sounds: Normal breath sounds. No stridor. No wheezing or rhonchi.  Abdominal:     General: Abdomen is flat. Bowel sounds are normal. There is no distension.     Palpations: Abdomen is soft. There is no mass.     Tenderness: There is no abdominal tenderness. There is no guarding.  Musculoskeletal:     Cervical back: Normal range of motion and neck supple. No rigidity or tenderness.     Right lower leg: No  edema.     Left lower leg: No edema.  Lymphadenopathy:     Cervical: No cervical adenopathy.  Skin:    General: Skin is warm and dry.     Coloration: Skin is not jaundiced.     Findings: No bruising, erythema or rash.  Neurological:     General: No focal deficit present.     Mental Status: She is alert and oriented to person, place, and time.     Sensory: No  sensory deficit.     Motor: No weakness.  Psychiatric:        Mood and Affect: Mood normal.        Behavior: Behavior normal.      No results found for any visits on 08/19/24. Last CBC Lab Results  Component Value Date   WBC 2.9 (L) 08/18/2023   HGB 13.2 08/18/2023   HCT 40.0 08/18/2023   MCV 96 08/18/2023   MCH 31.5 08/18/2023   RDW 12.4 08/18/2023   PLT 195 08/18/2023   Last metabolic panel Lab Results  Component Value Date   GLUCOSE 89 08/18/2023   NA 142 08/18/2023   K 4.2 08/18/2023   CL 103 08/18/2023   CO2 25 08/18/2023   BUN 16 08/18/2023   CREATININE 0.80 08/18/2023   EGFR 79 08/18/2023   CALCIUM 9.8 08/18/2023   PROT 6.9 08/18/2023   ALBUMIN 4.7 08/18/2023   LABGLOB 2.2 08/18/2023   BILITOT 0.6 08/18/2023   ALKPHOS 70 08/18/2023   AST 20 08/18/2023   ALT 19 08/18/2023   Last lipids Lab Results  Component Value Date   CHOL 232 (H) 08/18/2023   HDL 73 08/18/2023   LDLCALC 137 (H) 08/18/2023   TRIG 128 08/18/2023   CHOLHDL 3.2 08/18/2023   Last hemoglobin A1c No results found for: HGBA1C Last thyroid functions Lab Results  Component Value Date   TSH 4.40 06/13/2017   Last vitamin D  Lab Results  Component Value Date   VD25OH 49 07/03/2021   Last vitamin B12 and Folate No results found for: VITAMINB12, FOLATE      Assessment & Plan:    Routine Health Maintenance and Physical Exam  Immunization History  Administered Date(s) Administered   Fluad Quad(high Dose 65+) 06/29/2020, 07/03/2021   INFLUENZA, HIGH DOSE SEASONAL PF 06/19/2017, 06/16/2019   Influenza, Quadrivalent,  Recombinant, Inj, Pf 07/09/2018   PFIZER(Purple Top)SARS-COV-2 Vaccination 11/10/2018, 12/09/2019, 07/17/2020, 04/13/2021   Pneumococcal Conjugate-13 12/15/2015   Pneumococcal Polysaccharide-23 06/19/2017   Tdap 06/19/2017   Zoster Recombinant(Shingrix) 06/02/2018, 09/22/2018    Health Maintenance  Topic Date Due   Medicare Annual Wellness (AWV)  03/05/2022   Influenza Vaccine  05/14/2024   Mammogram  09/23/2024   COVID-19 Vaccine (5 - 2025-26 season) 08/19/2025 (Originally 06/14/2024)   DEXA SCAN  09/11/2024   DTaP/Tdap/Td (2 - Td or Tdap) 06/20/2027   Colonoscopy  09/02/2033   Pneumococcal Vaccine: 50+ Years  Completed   Zoster Vaccines- Shingrix  Completed   Meningococcal B Vaccine  Aged Out   Hepatitis C Screening  Discontinued    Discussed health benefits of physical activity, and encouraged her to engage in regular exercise appropriate for her age and condition.  Problem List Items Addressed This Visit     Essential hypertension   Relevant Medications   losartan  (COZAAR ) 50 MG tablet   Leukopenia   Age-related osteoporosis without current pathological fracture   Relevant Medications   Calcium Carbonate-Vit D-Min (CALCIUM 600+D PLUS MINERALS) 600-400 MG-UNIT TABS   Other Relevant Orders   DG Bone Density   Other Visit Diagnoses       Routine adult health maintenance    -  Primary   Relevant Orders   CMP14+EGFR   CBC w/Diff/Platelet   TSH   Lipid panel   HgB A1c     Immunization due       Relevant Orders   Flu vaccine HIGH DOSE PF(Fluzone Trivalent)     Encounter for screening mammogram for malignant neoplasm of  breast       Relevant Orders   MM DIGITAL SCREENING BILATERAL      Return in about 1 year (around 08/19/2025) for Annual Physical.   Pt not fasting today, she will return with her lab slip to complete her labs. She does have a hx of low WBC count. 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 Denies recurrent infections.  Osteoporosis - pt will maximize Ca and Vit D intake, as well as increase light weight lifting.  Mammogram and bone density tests ordered. Flu shot administered, defers covid vaccine    Benton LITTIE Gave, PA

## 2024-09-04 LAB — CMP14+EGFR
ALT: 16 IU/L (ref 0–32)
AST: 19 IU/L (ref 0–40)
Albumin: 4.4 g/dL (ref 3.8–4.8)
Alkaline Phosphatase: 59 IU/L (ref 49–135)
BUN/Creatinine Ratio: 19 (ref 12–28)
BUN: 15 mg/dL (ref 8–27)
Bilirubin Total: 0.7 mg/dL (ref 0.0–1.2)
CO2: 24 mmol/L (ref 20–29)
Calcium: 9.8 mg/dL (ref 8.7–10.3)
Chloride: 103 mmol/L (ref 96–106)
Creatinine, Ser: 0.8 mg/dL (ref 0.57–1.00)
Globulin, Total: 1.7 g/dL (ref 1.5–4.5)
Glucose: 85 mg/dL (ref 70–99)
Potassium: 4.4 mmol/L (ref 3.5–5.2)
Sodium: 140 mmol/L (ref 134–144)
Total Protein: 6.1 g/dL (ref 6.0–8.5)
eGFR: 78 mL/min/1.73 (ref >59)

## 2024-09-04 LAB — CBC WITH DIFFERENTIAL/PLATELET
Basophils Absolute: 0 x10E3/uL (ref 0.0–0.2)
Basos: 1 %
EOS (ABSOLUTE): 0.1 x10E3/uL (ref 0.0–0.4)
Eos: 4 %
Hematocrit: 39.8 % (ref 34.0–46.6)
Hemoglobin: 12.7 g/dL (ref 11.1–15.9)
Immature Grans (Abs): 0 x10E3/uL (ref 0.0–0.1)
Immature Granulocytes: 0 %
Lymphocytes Absolute: 0.8 x10E3/uL (ref 0.7–3.1)
Lymphs: 24 %
MCH: 30.5 pg (ref 26.6–33.0)
MCHC: 31.9 g/dL (ref 31.5–35.7)
MCV: 96 fL (ref 79–97)
Monocytes Absolute: 0.3 x10E3/uL (ref 0.1–0.9)
Monocytes: 8 %
Neutrophils Absolute: 2.1 x10E3/uL (ref 1.4–7.0)
Neutrophils: 63 %
Platelets: 208 x10E3/uL (ref 150–450)
RBC: 4.16 x10E6/uL (ref 3.77–5.28)
RDW: 12.5 % (ref 11.7–15.4)
WBC: 3.4 x10E3/uL (ref 3.4–10.8)

## 2024-09-04 LAB — HEMOGLOBIN A1C
Est. average glucose Bld gHb Est-mCnc: 88 mg/dL
Hgb A1c MFr Bld: 4.7 % — ABNORMAL LOW (ref 4.8–5.6)

## 2024-09-04 LAB — LIPID PANEL
Chol/HDL Ratio: 3.2 ratio (ref 0.0–4.4)
Cholesterol, Total: 215 mg/dL — ABNORMAL HIGH (ref 100–199)
HDL: 68 mg/dL (ref >39)
LDL Chol Calc (NIH): 124 mg/dL — ABNORMAL HIGH (ref 0–99)
Triglycerides: 133 mg/dL (ref 0–149)
VLDL Cholesterol Cal: 23 mg/dL (ref 5–40)

## 2024-09-04 LAB — TSH: TSH: 3.38 u[IU]/mL (ref 0.450–4.500)

## 2024-09-05 ENCOUNTER — Ambulatory Visit: Payer: Self-pay | Admitting: Urgent Care

## 2024-09-17 ENCOUNTER — Ambulatory Visit

## 2024-09-17 DIAGNOSIS — M810 Age-related osteoporosis without current pathological fracture: Secondary | ICD-10-CM

## 2024-09-26 ENCOUNTER — Other Ambulatory Visit: Payer: Self-pay | Admitting: Medical Genetics

## 2024-09-28 ENCOUNTER — Ambulatory Visit: Payer: Self-pay

## 2024-09-28 ENCOUNTER — Encounter: Payer: Self-pay | Admitting: Family Medicine

## 2024-09-28 ENCOUNTER — Ambulatory Visit: Admitting: Family Medicine

## 2024-09-28 VITALS — BP 144/78 | HR 71 | Temp 97.8°F | Resp 16 | Ht 64.0 in | Wt 171.0 lb

## 2024-09-28 DIAGNOSIS — J0100 Acute maxillary sinusitis, unspecified: Secondary | ICD-10-CM | POA: Insufficient documentation

## 2024-09-28 MED ORDER — PREDNISONE 20 MG PO TABS: 20.0000 mg | ORAL_TABLET | Freq: Two times a day (BID) | ORAL | 0 refills | Status: AC

## 2024-09-28 MED ORDER — DOXYCYCLINE HYCLATE 100 MG PO TABS: 100.0000 mg | ORAL_TABLET | Freq: Two times a day (BID) | ORAL | 0 refills | Status: AC

## 2024-09-28 NOTE — Telephone Encounter (Signed)
 Patient triaged in another encounter, seen in office today.

## 2024-09-28 NOTE — Telephone Encounter (Signed)
 FYI Only or Action Required?: FYI only for provider: appointment scheduled on 09/28/2024 at 10:50 AM.  Patient was last seen in primary care on 08/19/2024 by Lowella Folks L, PA.  Called Nurse Triage reporting Facial Pain.  Symptoms began a week ago.  Interventions attempted: Rest, hydration, or home remedies.  Symptoms are: unchanged.  Triage Disposition: See HCP Within 4 Hours (Or PCP Triage)  Patient/caregiver understands and will follow disposition?: Yes  Copied from CRM #[id number]. Topic: Clinical - Red Word Triage >> Sep 28, 2024  8:19 AM Antony RAMAN wrote: Red Word that prompted transfer to Nurse Triage: returning a missed call from NT. Note from yesterday was The patient reports being sick since 12/7 and is experiencing sneezing, severe congestion, difficulty breathing, and body aches Reason for Disposition  [1] SEVERE sinus pain (e.g., excruciating) AND [2] not improved 2 hours after pain medicine  Answer Assessment - Initial Assessment Questions Reports sinus congestion and sinus pain since 09/19/2024. Patient reports no measure fever but endorses chills. Scheduled to see a provider in PCP office today at 10:50 AM.  1. LOCATION: Where does it hurt?      Under and above eyes where sinus cavities are located 2. ONSET: When did the sinus pain start?  (e.g., hours, days)      Started on 09/19/2024 3. SEVERITY: How bad is the pain?   (Scale 0-10; or none, mild, moderate or severe)     7 4. RECURRENT SYMPTOM: Have you ever had sinus problems before? If Yes, ask: When was the last time? and What happened that time?      Yes-hasn't had symptoms for a couple of years  5. NASAL CONGESTION: Is the nose blocked? If Yes, ask: Can you open it or must you breathe through your mouth?     Yes-nose is blocked 6. NASAL DISCHARGE: Do you have discharge from your nose? If so ask, What color?     No color 7. FEVER: Do you have a fever? If Yes, ask: What is it, how was it  measured, and when did it start?      No fever 8. OTHER SYMPTOMS: Do you have any other symptoms? (e.g., sore throat, cough, earache, difficulty breathing)     Shortness of breath, body aches, severe congestion, chills, sneezing  Protocols used: Sinus Pain or Congestion-A-AH

## 2024-09-28 NOTE — Assessment & Plan Note (Addendum)
 Protracted, worsening symptoms.  Managing with combination of doxycycline  and prednisone .  Continue supportive care with increased fluids.  Red flags reviewed.

## 2024-09-28 NOTE — Progress Notes (Signed)
 Carol Sanford - 73 y.o. female MRN [id number]  Date of birth: 12-11-1950  Subjective No chief complaint on file.   HPI:  Carol Sanford is a 73 y.o. female here today with complaint of nasal congestsion, sinus pressure and pain.  Symptoms started about 10-11 days ago. She though it would get better but has continued to worsen.  She denies fever or chills.  No lower respiratory symptoms.    So far she has tried coricidin and sinus rinse without much improvement.   ROS:  A comprehensive ROS was completed and negative except as noted per HPI  Allergies[1]  Past Medical History:  Diagnosis Date   Age-related osteoporosis without current pathological fracture 08/19/2024   Allergy Seasonal   Arthritis Unsure   Cataract Both removed   Encounter for Medicare annual wellness exam 09/17/2023   Hypertension     Past Surgical History:  Procedure Laterality Date   APPENDECTOMY  1965   BREAST BIOPSY Left    benign 9 to 10 years ago    EYE SURGERY Left 04/03/2017   Cataract   HYSTEROSCOPY  2000   KNEE SURGERY Left 1997    Social History   Socioeconomic History   Marital status: Married    Spouse name: Alm   Number of children: 2   Years of education: 16   Highest education level: Bachelor's degree (e.g., BA, AB, BS)  Occupational History    Comment: Retired  Tobacco Use   Smoking status: Never   Smokeless tobacco: Never  Vaping Use   Vaping status: Never Used  Substance and Sexual Activity   Alcohol use: Yes    Alcohol/week: 2.0 standard drinks of alcohol    Types: 2 Glasses of wine per week   Drug use: Never   Sexual activity: Yes    Birth control/protection: Post-menopausal  Other Topics Concern   Not on file  Social History Narrative   Lives with her husband. She enjoys reading, walking and gardening.   Social Drivers of Health   Tobacco Use: Low Risk (09/28/2024)   Patient History    Smoking Tobacco Use: Never    Smokeless Tobacco Use: Never    Passive Exposure:  Not on file  Financial Resource Strain: Low Risk (08/18/2024)   Overall Financial Resource Strain (CARDIA)    Difficulty of Paying Living Expenses: Not hard at all  Food Insecurity: No Food Insecurity (08/18/2024)   Epic    Worried About Programme Researcher, Broadcasting/film/video in the Last Year: Never true    Ran Out of Food in the Last Year: Never true  Transportation Needs: No Transportation Needs (08/18/2024)   Epic    Lack of Transportation (Medical): No    Lack of Transportation (Non-Medical): No  Physical Activity: Sufficiently Active (08/18/2024)   Exercise Vital Sign    Days of Exercise per Week: 4 days    Minutes of Exercise per Session: 90 min  Stress: No Stress Concern Present (08/18/2024)   [employer] of Occupational Health - Occupational Stress Questionnaire    Feeling of Stress: Not at all  Social Connections: Socially Integrated (08/18/2024)   Social Connection and Isolation Panel    Frequency of Communication with Friends and Family: More than three times a week    Frequency of Social Gatherings with Friends and Family: More than three times a week    Attends Religious Services: More than 4 times per year    Active Member of [employer] or Organizations: Yes    Attends [employer]  or Organization Meetings: More than 4 times per year    Marital Status: Married  Depression (PHQ2-9): Low Risk (09/28/2024)   Depression (PHQ2-9)    PHQ-2 Score: 0  Alcohol Screen: Low Risk (08/18/2024)   Alcohol Screen    Last Alcohol Screening Score (AUDIT): 2  Housing: Low Risk (08/18/2024)   Epic    Unable to Pay for Housing in the Last Year: No    Number of Times Moved in the Last Year: 0    Homeless in the Last Year: No  Utilities: Not on file  Health Literacy: Not on file    Family History  Problem Relation Age of Onset   Heart attack Mother    Stroke Mother    Heart disease Mother    Alcohol abuse Father    Hypertension Father     Health Maintenance  Topic Date Due   Medicare Annual Wellness (AWV)   03/05/2022   Mammogram  09/23/2024   COVID-19 Vaccine (5 - 2025-26 season) 08/19/2025 (Originally 06/14/2024)   Bone Density Scan  09/17/2026   DTaP/Tdap/Td (2 - Td or Tdap) 06/20/2027   Colonoscopy  09/02/2033   Pneumococcal Vaccine: 50+ Years  Completed   Influenza Vaccine  Completed   Zoster Vaccines- Shingrix  Completed   Meningococcal B Vaccine  Aged Out   Hepatitis C Screening  Discontinued     ----------------------------------------------------------------------------------------------------------------------------------------------------------------------------------------------------------------- Physical Exam BP (!) 144/78 (BP Location: Right Arm, Patient Position: Sitting, Cuff Size: Normal)   Pulse 71   Temp 97.8 F (36.6 C) (Oral)   Resp 16   Ht 5' 4 (1.626 m)   Wt 171 lb (77.6 kg)   SpO2 94%   BMI 29.35 kg/m   Physical Exam Constitutional:      Appearance: Normal appearance.  HENT:     Right Ear: Tympanic membrane normal.     Left Ear: Tympanic membrane normal.     Nose:     Comments: B/L maxillary sinus tenderness.  Eyes:     General: No scleral icterus. Cardiovascular:     Rate and Rhythm: Normal rate and regular rhythm.  Pulmonary:     Effort: Pulmonary effort is normal.     Breath sounds: Normal breath sounds.  Neurological:     Mental Status: She is alert.  Psychiatric:        Mood and Affect: Mood normal.        Behavior: Behavior normal.     ------------------------------------------------------------------------------------------------------------------------------------------------------------------------------------------------------------------- Assessment and Plan  Acute maxillary sinusitis Protracted, worsening symptoms.  Managing with combination of doxycycline  and prednisone .  Continue supportive care with increased fluids.  Red flags reviewed.     Meds ordered this encounter  Medications   doxycycline  (VIBRA -TABS) 100 MG tablet     Sig: Take 1 tablet (100 mg total) by mouth 2 (two) times daily.    Dispense:  20 tablet    Refill:  0   predniSONE  (DELTASONE ) 20 MG tablet    Sig: Take 1 tablet (20 mg total) by mouth 2 (two) times daily with a meal for 5 days.    Dispense:  10 tablet    Refill:  0    No follow-ups on file.        [1]  Allergies Allergen Reactions   Sulfa Antibiotics Itching   Lisinopril Cough

## 2024-09-28 NOTE — Telephone Encounter (Signed)
 1st attempt, LVM   Copied from CRM #[id number]. Topic: Clinical - Red Word Triage >> Sep 27, 2024 12:50 PM Antwanette L wrote: Red Word that prompted transfer to Nurse Triage: The patient reports being sick since 12/7 and is experiencing sneezing, severe congestion, difficulty breathing, and body aches >> Sep 27, 2024 12:55 PM Antwanette L wrote: Patient unable to remain on hold due to long wait time.She is requesting a callback at [phone]

## 2024-09-28 NOTE — Telephone Encounter (Signed)
 Patient shows scheduled today 09/28/2024 with Carol Sanford.

## 2024-09-28 NOTE — Patient Instructions (Signed)

## 2024-09-30 ENCOUNTER — Ambulatory Visit

## 2024-09-30 DIAGNOSIS — Z1231 Encounter for screening mammogram for malignant neoplasm of breast: Secondary | ICD-10-CM

## 2024-10-13 ENCOUNTER — Other Ambulatory Visit: Payer: Self-pay | Admitting: Family Medicine

## 2024-10-13 ENCOUNTER — Telehealth: Payer: Self-pay

## 2024-10-13 ENCOUNTER — Encounter: Payer: Self-pay | Admitting: Family Medicine

## 2024-10-13 ENCOUNTER — Encounter

## 2024-10-13 MED ORDER — FLUCONAZOLE 150 MG PO TABS: 150.0000 mg | ORAL_TABLET | Freq: Once | ORAL | 0 refills | Status: AC

## 2024-10-13 NOTE — Telephone Encounter (Signed)
 Already addressed in a MyChart.  Closing document.

## 2024-10-13 NOTE — Telephone Encounter (Signed)
 Copied from CRM #[id number]. Topic: Clinical - Medication Question >> Oct 13, 2024  8:53 AM Myrick T wrote: Reason for CRM: patient said she was on an antibiotic for sinus infection and she now has a possible yeast infection. Patient is requesting a script for the diflucan pill. Please f/u with patient

## 2025-08-19 ENCOUNTER — Encounter: Admitting: Urgent Care
